# Patient Record
Sex: Female | Born: 1955
Health system: Southern US, Community
[De-identification: ages and names within clinical notes are randomized; demographics above are authoritative.]

## PROBLEM LIST (undated history)

## (undated) DIAGNOSIS — F419 Anxiety disorder, unspecified: Secondary | ICD-10-CM

## (undated) DIAGNOSIS — C801 Malignant (primary) neoplasm, unspecified: Secondary | ICD-10-CM

## (undated) DIAGNOSIS — Z9049 Acquired absence of other specified parts of digestive tract: Secondary | ICD-10-CM

## (undated) DIAGNOSIS — C541 Malignant neoplasm of endometrium: Secondary | ICD-10-CM

## (undated) DIAGNOSIS — F32A Depression, unspecified: Secondary | ICD-10-CM

## (undated) DIAGNOSIS — G4700 Insomnia, unspecified: Secondary | ICD-10-CM

## (undated) DIAGNOSIS — I10 Essential (primary) hypertension: Principal | ICD-10-CM

## (undated) DIAGNOSIS — F329 Major depressive disorder, single episode, unspecified: Secondary | ICD-10-CM

## (undated) DIAGNOSIS — Z720 Tobacco use: Secondary | ICD-10-CM

## (undated) DIAGNOSIS — Z923 Personal history of irradiation: Secondary | ICD-10-CM

## (undated) DIAGNOSIS — R002 Palpitations: Secondary | ICD-10-CM

## (undated) DIAGNOSIS — E669 Obesity, unspecified: Secondary | ICD-10-CM

## (undated) DIAGNOSIS — R05 Cough: Secondary | ICD-10-CM

## (undated) DIAGNOSIS — G629 Polyneuropathy, unspecified: Secondary | ICD-10-CM

## (undated) HISTORY — DX: Malignant neoplasm of endometrium: C54.1

## (undated) HISTORY — PX: LUMBAR DISC SURGERY: SHX700

## (undated) HISTORY — DX: Obesity, unspecified: E66.9

## (undated) HISTORY — DX: Personal history of irradiation: Z92.3

## (undated) HISTORY — PX: TUBAL LIGATION: SHX77

## (undated) HISTORY — DX: Tobacco use: Z72.0

## (undated) HISTORY — DX: Cough: R05

## (undated) HISTORY — DX: Essential (primary) hypertension: I10

## (undated) HISTORY — DX: Major depressive disorder, single episode, unspecified: F32.9

## (undated) HISTORY — DX: Depression, unspecified: F32.A

## (undated) HISTORY — DX: Polyneuropathy, unspecified: G62.9

## (undated) HISTORY — DX: Palpitations: R00.2

## (undated) HISTORY — DX: Anxiety disorder, unspecified: F41.9

## (undated) HISTORY — DX: Acquired absence of other specified parts of digestive tract: Z90.49

---

## 2003-10-04 ENCOUNTER — Ambulatory Visit (HOSPITAL_COMMUNITY): Admission: RE | Admit: 2003-10-04 | Discharge: 2003-10-05 | Payer: Self-pay | Admitting: Specialist

## 2005-09-10 HISTORY — PX: CHOLECYSTECTOMY: SHX55

## 2006-02-05 ENCOUNTER — Ambulatory Visit (HOSPITAL_COMMUNITY): Admission: RE | Admit: 2006-02-05 | Discharge: 2006-02-06 | Payer: Self-pay | Admitting: Neurosurgery

## 2008-10-03 ENCOUNTER — Ambulatory Visit: Payer: Self-pay | Admitting: Gastroenterology

## 2008-10-03 ENCOUNTER — Inpatient Hospital Stay (HOSPITAL_COMMUNITY): Admission: EM | Admit: 2008-10-03 | Discharge: 2008-10-06 | Payer: Self-pay | Admitting: Emergency Medicine

## 2008-10-04 ENCOUNTER — Encounter (INDEPENDENT_AMBULATORY_CARE_PROVIDER_SITE_OTHER): Payer: Self-pay | Admitting: *Deleted

## 2010-12-25 LAB — CBC
HCT: 36.6 % (ref 36.0–46.0)
HCT: 43.6 % (ref 36.0–46.0)
Hemoglobin: 14.7 g/dL (ref 12.0–15.0)
MCHC: 32.9 g/dL (ref 30.0–36.0)
MCV: 89.5 fL (ref 78.0–100.0)
Platelets: 132 10*3/uL — ABNORMAL LOW (ref 150–400)
RBC: 4.93 MIL/uL (ref 3.87–5.11)
RDW: 13.8 % (ref 11.5–15.5)
WBC: 5.4 10*3/uL (ref 4.0–10.5)

## 2010-12-25 LAB — URINALYSIS, ROUTINE W REFLEX MICROSCOPIC
Glucose, UA: NEGATIVE mg/dL
Ketones, ur: 15 mg/dL — AB
Nitrite: NEGATIVE
Protein, ur: NEGATIVE mg/dL

## 2010-12-25 LAB — COMPREHENSIVE METABOLIC PANEL
ALT: 679 U/L — ABNORMAL HIGH (ref 0–35)
AST: 109 U/L — ABNORMAL HIGH (ref 0–37)
Albumin: 3.4 g/dL — ABNORMAL LOW (ref 3.5–5.2)
Alkaline Phosphatase: 302 U/L — ABNORMAL HIGH (ref 39–117)
BUN: 14 mg/dL (ref 6–23)
BUN: 8 mg/dL (ref 6–23)
CO2: 24 mEq/L (ref 19–32)
CO2: 28 mEq/L (ref 19–32)
Calcium: 8.8 mg/dL (ref 8.4–10.5)
Chloride: 108 mEq/L (ref 96–112)
Chloride: 108 mEq/L (ref 96–112)
Creatinine, Ser: 0.74 mg/dL (ref 0.4–1.2)
GFR calc Af Amer: >60 mL/min (ref >60)
GFR calc non Af Amer: >60 mL/min (ref >60)
GFR calc non Af Amer: >60 mL/min (ref >60)
Glucose, Bld: 104 mg/dL — ABNORMAL HIGH (ref 70–99)
Potassium: 3.8 mEq/L (ref 3.5–5.1)
Total Bilirubin: 1.3 mg/dL — ABNORMAL HIGH (ref 0.3–1.2)
Total Bilirubin: 2.4 mg/dL — ABNORMAL HIGH (ref 0.3–1.2)

## 2010-12-25 LAB — URINE MICROSCOPIC-ADD ON

## 2010-12-25 LAB — DIFFERENTIAL
Basophils Absolute: 0 10*3/uL (ref 0.0–0.1)
Basophils Relative: 1 % (ref 0–1)
Eosinophils Absolute: 0.2 10*3/uL (ref 0.0–0.7)
Neutro Abs: 3.1 10*3/uL (ref 1.7–7.7)
Neutrophils Relative %: 58 % (ref 43–77)

## 2010-12-25 LAB — POCT I-STAT, CHEM 8
Chloride: 108 mEq/L (ref 96–112)
Glucose, Bld: 103 mg/dL — ABNORMAL HIGH (ref 70–99)
HCT: 47 % — ABNORMAL HIGH (ref 36.0–46.0)
Potassium: 3.8 mEq/L (ref 3.5–5.1)
Sodium: 141 mEq/L (ref 135–145)

## 2010-12-25 LAB — PROTIME-INR
INR: 1.1 (ref 0.00–1.49)
Prothrombin Time: 14.4 seconds (ref 11.6–15.2)

## 2011-01-23 NOTE — Op Note (Signed)
Amy Holmes, JOLLIFFE                ACCOUNT NO.:  [id number]   MEDICAL RECORD NO.:  [id number]          PATIENT TYPE:  INP   LOCATION:  5121                         FACILITY:  [facility]   PHYSICIAN:  Alfonse Ras, MD   DATE OF BIRTH:  [birth year]   DATE OF PROCEDURE:  10/04/2008  DATE OF DISCHARGE:                               OPERATIVE REPORT   PREOPERATIVE DIAGNOSIS:  Choledocholithiasis, acute cholecystitis.   POSTOPERATIVE DIAGNOSIS:  Choledocholithiasis, acute cholecystitis.   PROCEDURE:  Laparoscopic cholecystectomy with intraoperative  cholangiogram.   FINDINGS:  Distal retained common bile duct stone and retrieval of 3  common bile duct stones.   SURGEON:  Alfonse Ras, MD   ANESTHESIA:  General.   ASSISTANT:  Letha Cape, PA   DESCRIPTION:  After informed consent was granted from the patient, she  was taken to the operating room, placed in supine position and after  adequate general anesthesia was induced using endotracheal tube, the  abdomen was prepped and draped in normal sterile fashion.  Using a  transverse simple infraumbilical incision, I dissected down to fascia.  Fascia was opened vertically.  An 0 Vicryl pursestring suture was placed  around the fascial defect and Hasson trocar was placed in the abdomen.  Pneumoperitoneum was obtained.  Under direct vision, 11-mm trocar was  placed in the subxiphoid region and two 5-mm trocars were placed in the  right abdomen.  Gallbladder was identified and retracted cephalad.  Dissection at the neck of the gallbladder easily visualized the cystic  duct, which was clipped proximally.  Critical view was obtained prior to  clipping.  A small ductotomy was made and a number of stones in the  cystic duct were milked out.  Also with some pressure on the common  duct, two small stones were evacuated.  Cholangiocatheter was then  placed and cholangiogram was performed.  It showed no flow into the  duodenum and a dilated  common bile duct.  There was a filling defect  which floated consistent with a distal common bile duct stone.  With  advancing the catheter which showed a fairly acute angle, it was  difficult to get the balloon down below the stone.  Therefore, the  cholangiocatheter was removed.  The cystic duct was triply clipped and  divided.  Cystic artery was identified in a similar fashion, triply  clipped and divided.  Gallbladder was taken off the gallbladder bed  using Bovie electrocautery, placed in EndoCatch bag and removed through  the umbilical port.  Adequate hemostasis was assured.  The infraumbilical fascial defect was  closed with the 0 Vicryl pursestring suture.  Skin incisions were closed  with subcuticular 4-0 Monocryl.  Steri-Strips and sterile dressings were  applied.  The patient tolerated the procedure well, went to PACU in good  condition.      Alfonse Ras, MD  Electronically Signed     KRE/MEDQ  D:  10/04/2008  T:  10/05/2008  Job:  [id number]   cc:   Jordan Hawks. Elnoria Howard, MD

## 2011-01-23 NOTE — H&P (Signed)
Amy Holmes, Amy Holmes                ACCOUNT NO.:  [id number]   MEDICAL RECORD NO.:  [id number]          PATIENT TYPE:  INP   LOCATION:  5121                         FACILITY:  [facility]   PHYSICIAN:  Juanetta Gosling, MDDATE OF BIRTH:  [birth year]   DATE OF ADMISSION:  10/03/2008  DATE OF DISCHARGE:                              HISTORY & PHYSICAL   CHIEF COMPLAINT:  Epigastric pain.   HISTORY OF PRESENT ILLNESS:  A 55 year old female who developed  epigastric pain last Sunday, continued over the last week.  She does  complain of some dark-colored urine.  She has not been eating much over  the last week due to the pain.  She does have a history of dyspepsia as  well for the last 4 years.  She denies any fevers, has had some nausea  and had 1 episode of emesis, also with some loose stool.   PAST MEDICAL HISTORY:  Negative.   PAST SURGICAL HISTORY:  Bilateral tubal ligation and lumbar surgery x2.   SOCIAL HISTORY:  She is smoker and drinks alcohol socially.   ALLERGIES:  She has no known drug allergies.   REVIEW OF SYSTEMS:  Otherwise, negative.   PHYSICAL EXAMINATION:  VITAL SIGNS:  97.0, 79, 16, and 159/90.  GENERAL:  She is a well-appearing female in no distress.  NECK:  Supple without adenopathy.  LUNGS:  Clear bilaterally.  HEART:  Regular rate and rhythm.  ABDOMEN:  Soft, nontender, and nondistended with no Murphy sign.   LABORATORY DATA:  White blood cell count of 5.4, hematocrit of 43.6, and  platelets of 170.  Lipase is 26.  BUN and creatinine 14 and 0.63.  Her  alkaline phosphatase is 302.  Her transaminases are 282 and 679.  Her  bilirubin is 2.4.  She has an ultrasound.  A CT was done with biliary  ductal dilatation.  Question is choledocholithiasis and symptomatic  cholelithiasis .   PLAN OF ACTION:  Admission, n.p.o. after midnight, IV fluid, GI consult  for possible ERCP.  We will plan on rechecking her labs in the morning  and then proceed either with ERCP  versus laparoscopic cholecystectomy.  Regardless after an ERCP, she will need her gallbladder removed this  admission.     Juanetta Gosling, MD  Electronically Signed    MCW/MEDQ  D:  10/03/2008  T:  10/04/2008  Job:  [id number]

## 2011-01-23 NOTE — Discharge Summary (Signed)
NAMELYRIC, ROSSANO                ACCOUNT NO.:  [id number]   MEDICAL RECORD NO.:  [id number]          PATIENT TYPE:  INP   LOCATION:  5121                         FACILITY:  [facility]   PHYSICIAN:  Alfonse Ras, MD   DATE OF BIRTH:  [birth year]   DATE OF ADMISSION:  10/03/2008  DATE OF DISCHARGE:  10/06/2008                               DISCHARGE SUMMARY   ADMITTING PHYSICIAN:  Juanetta Gosling, MD   DISCHARGING PHYSICIAN:  Alfonse Ras, MD   CONSULTANTS:  Jordan Hawks. Elnoria Howard, MD with Gastroenterology.   PROCEDURES:  1. Laparoscopic cholecystectomy with intraoperative cholangiogram done      on October 04, 2008, by Dr. Colin Benton.  2. ERCP done by Dr. Elnoria Howard on October 05, 2008.   REASON FOR ADMISSION:  Amy Holmes is a 55 year old white female who  developed epigastric pain on Sunday night and continued over the next  week.  She has complained of some dark-colored urine.  She has not been  eating much secondary to her pain.  She denies fevers, but has had some  nausea and 1 episode of emesis.  On admission, her alkaline phosphatase  was 302.  Her transaminases are 282 and 679 with a total bilirubin of  2.4.  She does have a CT scan that shows biliary ductal dilatation and  question choledocholithiasis and symptomatic cholelithiasis.  Please see  admitting history and physical for further details.   ADMITTING DIAGNOSES:  1. Choledocholithiasis.  2. Symptomatic cholelithiasis.   HOSPITAL COURSE:  At this time, the patient was admitted and started on  Unasyn prophylactically.  A GI consult was obtained secondary to the  thought of choledocholithiasis as well as evidence from her labs.  At  this time, we were going to follow her labs and on the following day if  her labs were trending down, we will go ahead and proceed with a  laparoscopic cholecystectomy; however, if her labs were trending upwards  then we would have GI do an ERCP.  On the following day, the patient's  lab had  decreased and her total bilirubin was now 1.3.  Therefore at  this time, we proceeded with a laparoscopic cholecystectomy.  During  this, we did an intraoperative cholangiogram where it was found that the  patient did have some remaining common bile duct stones.  The patient  tolerated this procedure well and after the procedure, GI was called to  come back in and do an ERCP.  On the following day postoperative day 1,  the patient was feeling well and at this time was taken down for an  ERCP.  An ERCP was done with a sphincterotomy.  There were several small  stones that were extracted without difficulty.  The patient once again  tolerated this procedure well and her diet was advanced postprocedure.  By postoperative day 2, the patient was feeling great and ready to go  home.  She was not having any pain and was tolerating regular diet.  Her  abdomen was soft, nontender, and nondistended with active bowel sounds  and her incisions were  clean, dry, and intact.  At this time, the  patient was felt stable for discharge home.   DISCHARGE DIAGNOSES:  1. Choledocholithiasis.  2. Cholecystitis.  3. Cholelithiasis.  4. Status post laparoscopic cholecystectomy with intraoperative      cholangiogram.  5. Status post endoscopic retrograde cholangiopancreatography with      sphincterotomy.   DISCHARGE MEDICATIONS:  The patient does not have any home medications;  however, I have given her prescription for Percocet 5/325 one to two  p.o. q.4 h. p.r.n. pain.   DISCHARGE INSTRUCTIONS:  The patient is informed that she may return to  work within the next 1-2 weeks.  She has no dietary restrictions.  She  may increase her activity slowly and she may walk up steps.  She is  informed that she may shower; however, she is not to lift anything  greater than 15 pounds for the next 2 weeks.  She is also informed that  she may remove her outer tape and gauze today prior to showering.  She  is to call our  office for a fever greater than 101.5, worsening  abdominal pain or redness or pus-like drainage from any of her  incisions.  [facility] to [facility] to see myself on  October 19, 2008, at 2 p.m.  She is informed to call our office if she  cannot keep this appointment to reschedule.      Letha Cape, PA      Alfonse Ras, MD  Electronically Signed    KEO/MEDQ  D:  10/06/2008  T:  10/06/2008  Job:  [id number]   cc:   Jordan Hawks. Elnoria Howard, MD

## 2011-01-26 NOTE — Op Note (Signed)
NAME:  Amy Holmes, Amy Holmes                          ACCOUNT NO.:  [id number]   MEDICAL RECORD NO.:  [id number]                   PATIENT TYPE:  OIB   LOCATION:  2899                                 FACILITY:  [facility]   PHYSICIAN:  Jene Every, M.D.                 DATE OF BIRTH:  1956-03-15   DATE OF PROCEDURE:  10/04/2003  DATE OF DISCHARGE:                                 OPERATIVE REPORT   PREOPERATIVE DIAGNOSIS:  Spinal stenosis, herniated nucleus pulposus, L5.   POSTOPERATIVE DIAGNOSIS:  Spinal stenosis, herniated nucleus pulposus, L5.   OPERATION PERFORMED:  Lateral recess decompression, foraminotomy, L4-5.  Microdiskectomy.  Lateral recess decompression.   SURGEON:  Jene Every, M.D.   ANESTHESIA:  General.   ASSISTANT:  Roma Schanz, PA-C.Marland Kitchen   INDICATIONS FOR PROCEDURE:  The patient is a 55 year old with left lower  extremity radiculopathy, extensor hallucis longus weakness. MRI indicating  herniated nucleus pulposus at 4-5 paracentral to the left with lateral  recess stenosis, ligamentum flavum hypertrophy, facet arthropathy, positive  neural tension signs, EHL weakness, decreased sensation in the L5 dermatome  at a transitional segment of 4-5, last open disk space 4-5.  Operative  intervention was indicated for decompression.  Risks and benefits discussed  including bleeding, infection, damage to neurovascular structures, CSF  leakage, epidural fibrosis, adjacent segment disease, no change in symptoms.   DESCRIPTION OF PROCEDURE:  Patient supine position, after adequate level of  general anesthesia, she was placed prone on a Wilson frame.  500 mg IV  vancomycin.  Soft tissue was padded appropriately.  Lumbar region was  prepped and draped in the usual sterile fashion.  18 gauge needle was  utilized to localize the 4-5 interspace confirmed with x-ray.  Incision was  made from the spinous process 4 to 5.  Subcutaneous tissue dissected,  electrocautery utilized to  achieve hemostasis.  Dorsolumbar fascia  identified and divided in line of skin incision. Paraspinous muscle elevated  from lamina of 4 and 5.  McCullough retractor was placed.  Penfield 4 placed  in the interlaminar space confirmed on x-ray.  Operating microscope draped  and brought into the surgical field.  The ligamentum flavum detached from  the cephalad edge of 5 with a curet.  The caudad edge of 4.  Hemilaminotomy  performed of 4 and of 5 utilizing 2 and 3 mm Kerrison.  Severe compression  of the S1 nerve root and lateral recess stenosis secondary to ligamentum  flavum, facet hypertrophy.  This __________ decompressed with 2 mm Kerrison.  A nerve hook was utilized to mobilize the free fragment on the axilla of the  nerve root.  The nerve root was gently mobilized medially.  Two more large  fragments were mobilized from beneath the nerve root.  Checked down the  foramen.  There was no disk herniation noted there.  There was an extension  of the disk herniation and  fragment beneath the thecal sac.  This was  retrieved as well.  Annulotomy was performed and copious portion of the disk  material was removed from the disk space.  There was no residual disk  herniation noted.  The nerve was found to be erythematous and edematous but  well decompressed.  There was excursion of the root 1 cm medial to the  pedicle without difficulty.  Hockey stitch probe was placed in foramen and  found to be widely patent following decompression.  Disk space copiously  irrigated.  I again re-examined the axilla beneath the thecal sac and nerve  root, the foramina of 4 and 5 were found to be widely patent. There was no  compression upon the root.  Disk space copiously irrigated.  Bipolar  electrocautery utilized to achieve hemostasis as was Thrombin soaked  Gelfoam.  This was then all removed.  Disk space copiously irrigated once  again. No CSF leakage or active bleeding.  Removed the [facility] retractor.   Paraspinous muscles were inspected  no evidence of active bleeding.  Dorsal  lumbar fascia reapproximated in #1 Vicryl figure-of-eight sutures,  subcutaneous tissue reapproximated 2-0 Vicryl subcuticular, skin  reapproximated 4-0 subcuticular Prolene.  Wound reinforced with Steri-  Strips.  Sterile dressing applied.  She was placed supine on the hospital  bed, extubated without difficulty and transported to recovery room in  satisfactory condition.  The patient tolerated the procedure well.  There  were no complications.                                               Jene Every, M.D.    Cordelia Pen  D:  10/04/2003  T:  10/04/2003  Job:  [id number]

## 2011-01-26 NOTE — Op Note (Signed)
Amy Holmes, Amy Holmes                ACCOUNT NO.:  [id number]   MEDICAL RECORD NO.:  [id number]          PATIENT TYPE:  AMB   LOCATION:  SDS                          FACILITY:  [facility]   PHYSICIAN:  Payton Doughty, M.D.      DATE OF BIRTH:  [birth year]   DATE OF PROCEDURE:  02/05/2006  DATE OF DISCHARGE:                                 OPERATIVE REPORT   PREOPERATIVE DIAGNOSIS:  Recurrent herniated disk on the left at L5-S1.   POSTOPERATIVE DIAGNOSIS:  Recurrent herniated disk on the left at L5-S1.   OPERATION PERFORMED:  L5-S1 recurrent diskectomy.   SURGEON:  Payton Doughty, M.D.   ANESTHESIA:  General endotracheal.   PREP:  Sterile Betadine prep and scrub with alcohol wipe.   COMPLICATIONS:  None.   NURSE ASSISTANT:  [place].   DOCTOR ASSISTANT:  Clydene Fake, M.D.   INDICATIONS FOR PROCEDURE:  The patient is a 55 year old girl with  diskectomy several years ago, has had increasing pain in her left leg.   DESCRIPTION OF PROCEDURE:  The patient was taken to the operating room,  smoothly anesthetized, intubated, and placed prone on the operating table.  Following shave, prep and drape in usual fashion, old skin incision was  reopened and extended approximately 0.5 cm at each end.  The remaining  lamina of L5 dissected on the left side in the subperiosteal plane.  The  level was confirmed radiographically.  Working the scar away from the old  laminectomy site, slightly enlarging it with a Kerrison and __________ with  a high speed drill.  The left S1 root was identified.  Gentle lysis of the  scar revealed recurrent herniated disk underneath the root and this was  removed without difficulty.  Nerve root was explored carefully and found to  have no further encumbrance. The laminectomy defect was filled with Depo-  Medrol soaked fat.  The fascia and subcutaneous tissue was reapproximated  with 0 Vicryl in interrupted fashion.  Subcutaneous tissues reapproximated  with 2-0 Vicryl  in interrupted fashion.  Skin was closed with 3-0 nylon in  running locked fashion.  Betadine Telfa dressing was applied and made  occlusive with OpSite and the patient returned to recovery room in good  condition.           ______________________________  Payton Doughty, M.D.    MWR/MEDQ  D:  02/05/2006  T:  02/05/2006  Job:  [id number]

## 2011-01-26 NOTE — H&P (Signed)
Amy Holmes, Amy Holmes                ACCOUNT NO.:  [id number]   MEDICAL RECORD NO.:  [id number]          PATIENT TYPE:  AMB   LOCATION:  SDS                          FACILITY:  [facility]   PHYSICIAN:  Payton Doughty, M.D.      DATE OF BIRTH:  11-Nov-1955   DATE OF ADMISSION:  02/05/2006  DATE OF DISCHARGE:                                HISTORY & PHYSICAL   ADMITTING DIAGNOSES:  Recurrent herniated disk at L5-S1 on the left.   [name] had diskectomy done on left side at  L5-S1, L5 transitional segment.  Did well.  In November had an increase in  pain down her left leg and numbness lateral aspect left leg including the  left foot.  Saw her in my office.  Obtained an MR that shows a disk  recurrence at L5 transitional segment on the left side and she is admitted  now for redo diskectomy.   MEDICAL HISTORY:  Benign.  She is not on any medications.   SURGICAL HISTORY:  Back.   ALLERGIES:  None.   SOCIAL HISTORY:  She smokes a pack half cigarettes a day.  Drinks alcohol  once a week.  Is a Surveyor, minerals for [employer].   FAMILY HISTORY:  Mom is 59, in good health, diabetes.  Daddy is 72, in good  health, diabetes.   REVIEW OF SYSTEMS:  Remarkable for glasses, swelling in her hands and feet,  leg pain, and back pain.   PHYSICAL EXAMINATION:  HEENT:  Within normal limits.  She has reasonable  range of motion in her neck.  CHEST:  Clear.  CARDIAC:  Regular rate and rhythm.  ABDOMEN:  Nontender.  No hepatosplenomegaly.  EXTREMITIES:  Without clubbing, cyanosis.  GENITOURINARY:  Deferred.  PERIPHERAL PULSES:  Good.  NEUROLOGIC:  She is awake, alert, and oriented.  Cranial nerves are intact.  Motor examination shows 5/5 strength throughout the upper and lower  extremities.  Sensory deficit described in the left S1 distribution.  Reflexes are absent both lower extremities.  Straight leg raise and reverse  straight leg raise both positive for left  leg pain.   MR demonstrates recurrent herniated disk at L5-S1 eccentric to the left  side.   CLINICAL IMPRESSION:  Disk at L5-S1 and her L5 transitional segment.   PLAN:  Lumbar laminectomy, diskectomy.  Thought about fusing it but it has  been a while since her diskectomy and she has no evidence of instability so  hopefully just diskectomy will be sufficient.           ______________________________  Payton Doughty, M.D.     MWR/MEDQ  D:  02/05/2006  T:  02/05/2006  Job:  [id number]

## 2014-03-03 ENCOUNTER — Ambulatory Visit (INDEPENDENT_AMBULATORY_CARE_PROVIDER_SITE_OTHER): Payer: 59 | Admitting: Family Medicine

## 2014-03-03 ENCOUNTER — Encounter: Payer: Self-pay | Admitting: Family Medicine

## 2014-03-03 VITALS — BP 132/84 | HR 80 | Temp 98.1°F | Ht 66.5 in | Wt 192.0 lb

## 2014-03-03 DIAGNOSIS — Z7189 Other specified counseling: Secondary | ICD-10-CM

## 2014-03-03 DIAGNOSIS — Z7689 Persons encountering health services in other specified circumstances: Secondary | ICD-10-CM

## 2014-03-03 DIAGNOSIS — F32A Depression, unspecified: Secondary | ICD-10-CM

## 2014-03-03 DIAGNOSIS — F341 Dysthymic disorder: Secondary | ICD-10-CM

## 2014-03-03 DIAGNOSIS — R002 Palpitations: Secondary | ICD-10-CM

## 2014-03-03 DIAGNOSIS — F419 Anxiety disorder, unspecified: Secondary | ICD-10-CM

## 2014-03-03 DIAGNOSIS — F329 Major depressive disorder, single episode, unspecified: Secondary | ICD-10-CM

## 2014-03-03 MED ORDER — PAROXETINE HCL 10 MG PO TABS: 10.0000 mg | ORAL_TABLET | Freq: Every day | ORAL | Status: DC

## 2014-03-03 NOTE — Progress Notes (Signed)
No chief complaint on file.   HPI:  Amy Holmes is here to establish care.  Last PCP and physical: physical, mammo and pap in 2013 with a gynecologist  Has the following chronic problems and concerns today:  There are no active problems to display for this patient.  Stress/Hx of Depression/Poor sleep: -irritable, falls asleep ok but wakes up in the middle of the night, anxiety at times -denies: depression, crying, manix symptoms -hx mild depression on medication in the past -was exercising and taking good care of herself and [place] that [place] - but then her mom (and mother's boyfriend whom recently passed)moved in with her and has dementia and uncontrolled DM with complications, two sons recently moved in to help -occasional palpitations with stress the last 2 days, mild brief flutter sensation in chest, -denies: SI, CP, SOB  Tobacco Use: -cutting back -not a great time quit  Health Maintenance: - not interested in colon cancer screening or lung cancer screening ROS: See pertinent positives and negatives per HPI.  Past Medical History  Diagnosis Date  . Depression     Family History  Problem Relation Age of Onset  . Heart disease Mother   . Diabetes Mother   . Depression Maternal Grandmother   . Depression Maternal Grandfather     History   Social History  . Marital Status: Divorced    Spouse Name: N/A    Number of Children: N/A  . Years of Education: N/A   Social History Main Topics  . Smoking status: Current Every Day Smoker  . Smokeless tobacco: None     Comment: 5-7 cig per day  . Alcohol Use: Yes     Comment: 1 drink here and there  . Drug Use: None  . Sexual Activity: None   Other Topics Concern  . None   Social History Narrative   Work or School:      Home Situation:      Spiritual Beliefs:      Lifestyle:             Current outpatient prescriptions:PARoxetine (PAXIL) 10 MG tablet, Take 1 tablet (10 mg  total) by mouth daily., Disp: 30 tablet, Rfl: 3  EXAM:  Filed Vitals:   03/03/14 0818  BP: 132/84  Pulse: 80  Temp: 98.1 F (36.7 C)    Body mass index is 30.53 kg/(m^2).  GENERAL: vitals reviewed and listed above, alert, oriented, appears well hydrated and in no acute distress  HEENT: atraumatic, conjunttiva clear, no obvious abnormalities on inspection of external nose and ears  NECK: no obvious masses on inspection  LUNGS: clear to auscultation bilaterally, no wheezes, rales or rhonchi, good air movement  CV: HRRR, no peripheral edema  MS: moves all extremities without noticeable abnormality  PSYCH: pleasant and cooperative, no obvious depression or anxiety  ASSESSMENT AND PLAN:  Discussed the following assessment and plan:  Palpitations - Plan: Holter monitor - 24 hour -holter monitor, discussed etiologies, tx, return and emergency precautions etc  Anxiety and depression - Plan: PARoxetine (PAXIL) 10 MG tablet -discussed options, mild, she wants to do trial of paxil after discussion risks  Encounter to establish care  -We reviewed the PMH, PSH, FH, SH, Meds and Allergies. -We provided refills for any medications we will prescribe as needed. -We addressed current concerns per orders and patient instructions. -We have asked for records for pertinent exams, studies, vaccines and notes from previous providers. -We have advised patient to  follow up per instructions below.   -Patient advised to return or notify a doctor immediately if symptoms worsen or persist or new concerns arise.  Patient Instructions  -As we discussed, we have prescribed a new medication for you at this appointment. We discussed the common and serious potential adverse effects of this medication and you can review these and more with the pharmacist when you pick up your medication.  Please follow the instructions for use carefully and notify us immediately if you have any problems taking this  medication.  -we ordered the heart monitor  -follow up for physical and labs and follow up in 1 month - schedule today      KIM, Jarrett Soho R.

## 2014-03-03 NOTE — Patient Instructions (Signed)
-  As we discussed, we have prescribed a new medication for you at this appointment. We discussed the common and serious potential adverse effects of this medication and you can review these and more with the pharmacist when you pick up your medication.  Please follow the instructions for use carefully and notify us immediately if you have any problems taking this medication.  -we ordered the heart monitor  -follow up for physical and labs and follow up in 1 month - schedule today

## 2014-03-03 NOTE — Progress Notes (Signed)
Pre visit review using our clinic review tool, if applicable. No additional management support is needed unless otherwise documented below in the visit note. 

## 2014-03-09 ENCOUNTER — Encounter (INDEPENDENT_AMBULATORY_CARE_PROVIDER_SITE_OTHER): Payer: 59

## 2014-03-09 ENCOUNTER — Encounter: Payer: Self-pay | Admitting: *Deleted

## 2014-03-09 DIAGNOSIS — R002 Palpitations: Secondary | ICD-10-CM

## 2014-03-09 NOTE — Progress Notes (Signed)
Patient ID: Amy Holmes, female   DOB: [birth year], 58 y.o.   MRN: [id number] EVO 24 hour holter monitor applied to patient.

## 2014-03-22 ENCOUNTER — Encounter: Payer: Self-pay | Admitting: *Deleted

## 2014-03-22 ENCOUNTER — Telehealth: Payer: Self-pay | Admitting: Family Medicine

## 2014-03-22 DIAGNOSIS — I493 Ventricular premature depolarization: Secondary | ICD-10-CM

## 2014-03-22 DIAGNOSIS — I491 Atrial premature depolarization: Secondary | ICD-10-CM

## 2014-03-22 NOTE — Telephone Encounter (Signed)
[phone] (home) [phone] (work) Still with daily mild palpitations. No CP, SOB, dyspnea, sustained episodes of heart racing.Discussed results of holter monitor, etiologies, workup, tx - she opted to see cardiologist and referral placed.

## 2014-04-02 ENCOUNTER — Ambulatory Visit (INDEPENDENT_AMBULATORY_CARE_PROVIDER_SITE_OTHER): Payer: 59 | Admitting: Family Medicine

## 2014-04-02 ENCOUNTER — Encounter: Payer: Self-pay | Admitting: Family Medicine

## 2014-04-02 VITALS — BP 128/84 | HR 75 | Temp 98.3°F | Ht 66.5 in | Wt 192.0 lb

## 2014-04-02 DIAGNOSIS — F419 Anxiety disorder, unspecified: Principal | ICD-10-CM

## 2014-04-02 DIAGNOSIS — Z0000 Encounter for general adult medical examination without abnormal findings: Secondary | ICD-10-CM

## 2014-04-02 DIAGNOSIS — R002 Palpitations: Secondary | ICD-10-CM

## 2014-04-02 DIAGNOSIS — E669 Obesity, unspecified: Secondary | ICD-10-CM

## 2014-04-02 DIAGNOSIS — F17200 Nicotine dependence, unspecified, uncomplicated: Secondary | ICD-10-CM

## 2014-04-02 DIAGNOSIS — F329 Major depressive disorder, single episode, unspecified: Secondary | ICD-10-CM

## 2014-04-02 DIAGNOSIS — F32A Depression, unspecified: Secondary | ICD-10-CM

## 2014-04-02 DIAGNOSIS — F341 Dysthymic disorder: Secondary | ICD-10-CM

## 2014-04-02 LAB — BASIC METABOLIC PANEL
BUN: 18 mg/dL (ref 6–23)
CHLORIDE: 106 meq/L (ref 96–112)
CO2: 32 meq/L (ref 19–32)
CREATININE: 0.7 mg/dL (ref 0.4–1.2)
Calcium: 9.4 mg/dL (ref 8.4–10.5)
GFR: 95.7 mL/min (ref >60.00)
Glucose, Bld: 110 mg/dL — ABNORMAL HIGH (ref 70–99)
Potassium: 4.2 mEq/L (ref 3.5–5.1)
Sodium: 142 mEq/L (ref 135–145)

## 2014-04-02 LAB — LIPID PANEL
CHOL/HDL RATIO: 3
CHOLESTEROL: 197 mg/dL (ref 0–200)
HDL: 59.7 mg/dL (ref >39.00)
LDL CALC: 112 mg/dL — AB (ref 0–99)
NONHDL: 137.3
Triglycerides: 127 mg/dL (ref 0.0–149.0)
VLDL: 25.4 mg/dL (ref 0.0–40.0)

## 2014-04-02 LAB — T4, FREE: Free T4: 0.91 ng/dL (ref 0.60–1.60)

## 2014-04-02 LAB — HEMOGLOBIN A1C: Hgb A1c MFr Bld: 6.2 % (ref 4.6–6.5)

## 2014-04-02 LAB — TSH: TSH: 1.04 u[IU]/mL (ref 0.35–4.50)

## 2014-04-02 MED ORDER — PAROXETINE HCL 10 MG PO TABS: 10.0000 mg | ORAL_TABLET | Freq: Every day | ORAL | Status: DC

## 2014-04-02 NOTE — Patient Instructions (Signed)
-  We have ordered labs or studies at this visit. It can take up to 1-2 weeks for results and processing. We will contact you with instructions IF your results are abnormal. Normal results will be released to your [facility]. If you have not heard from [place] or can not find your results in [facility] in 2 weeks please contact our office.  -PLEASE SIGN UP FOR MYCHART TODAY   We recommend the following healthy lifestyle measures: - eat a healthy diet consisting of lots of vegetables, fruits, beans, nuts, seeds, healthy meats such as white chicken and fish and whole grains.  - avoid fried foods, fast food, processed foods, sodas, red meet and other fattening foods.  - get a least 150 minutes of aerobic exercise per week.   Please make sure to get your pap smear and mammogram with your gyneclogist  Please quit smoking  Follow up in: 3-4 months or sooner as needed

## 2014-04-02 NOTE — Progress Notes (Signed)
Pre visit review using our clinic review tool, if applicable. No additional management support is needed unless otherwise documented below in the visit note. 

## 2014-04-02 NOTE — Progress Notes (Signed)
No chief complaint on file.   HPI:  Follow up - wants preventive visit and labs too.  Stress/Hx of Depression/Poor sleep: -at [place] 03/03/14 reported family related stress, irritability, poor sleep, anxiety and hx of depression  -started paxil 10mg  daily 02/2014 -reports today: doing well, cutting back on cigs, trying to do something every night - walking, getting out of the house -denies: SI, thoughts of self harm  Palpitations: -at visit 03/03/14 she reported occasional palpitations with stress the last 2 days, mild brief flutter sensation in chest -holter monitor 02/2013 showed frequent PVCs, frequent PACs and occ short runs of SVT per report -reports today: has cut back on caffeine, doing better, only occ palpitations now -she wants to see the cardiologist - has appt scheduled for this in august -denies: SI, CP, SOB   Tobacco Use:  -cutting back on cigarettes -not a great time quit  HM: Sees gyn for female physical: -she has mammo and pap scheduled -colon cancer screening: reports had colonoscopy about 20 years ago   ROS: See pertinent positives and negatives per HPI. Review of Systems  Constitutional: Negative for fever, weight loss and malaise/fatigue.  HENT: Negative for hearing loss.   Eyes: Negative for blurred vision and pain.  Respiratory: Negative for cough, hemoptysis and shortness of breath.   Cardiovascular: Negative for chest pain and palpitations.  Gastrointestinal: Negative for blood in stool and melena.  Genitourinary: Negative for dysuria.  Musculoskeletal: Negative for falls.  Skin: Negative for rash.  Neurological: Negative for dizziness.  Endo/Heme/Allergies: Does not bruise/bleed easily.  Psychiatric/Behavioral: Negative for depression and memory loss.     Past Medical History  Diagnosis Date  . Depression     Past Surgical History  Procedure Laterality Date  . Lumbar disc surgery      Family History  Problem Relation Age of Onset  . Heart  disease Mother   . Diabetes Mother   . Depression Maternal Grandmother   . Depression Maternal Grandfather     History   Social History  . Marital Status: Divorced    Spouse Name: N/A    Number of Children: N/A  . Years of Education: N/A   Social History Main Topics  . Smoking status: Current Every Day Smoker  . Smokeless tobacco: None     Comment: 5-7 cig per day  . Alcohol Use: Yes     Comment: 1 drink here and there  . Drug Use: None  . Sexual Activity: None   Other Topics Concern  . None   Social History Narrative   Work or School:      Home Situation:      Spiritual Beliefs:      Lifestyle:             Current outpatient prescriptions:PARoxetine (PAXIL) 10 MG tablet, Take 1 tablet (10 mg total) by mouth daily., Disp: 90 tablet, Rfl: 3  EXAM:  Filed Vitals:   04/02/14 0849  BP: 128/84  Pulse: 75  Temp: 98.3 F (36.8 C)    Body mass index is 30.53 kg/(m^2).  GENERAL: vitals reviewed and listed above, alert, oriented, appears well hydrated and in no acute distress  HEENT: atraumatic, conjunttiva clear, no obvious abnormalities on inspection of external nose and ears  NECK: no obvious masses on inspection  LUNGS: clear to auscultation bilaterally, no wheezes, rales or rhonchi, good air movement  CV: HRRR, no peripheral edema  GU: declined  MS: moves all extremities without noticeable abnormality  PSYCH: pleasant  and cooperative, no obvious depression or anxiety  ASSESSMENT AND PLAN:  Discussed the following assessment and plan:  Anxiety and depression - Plan: TSH, T4, Free, PARoxetine (PAXIL) 10 MG tablet  Palpitations - Plan: TSH, T4, Free, Lipid panel  Tobacco use disorder  Obesity, unspecified - Plan: TSH, Basic metabolic panel, Lipid panel, Hemoglobin A1c  Visit for preventive health examination - Plan: TSH, T4, Free, Basic metabolic panel, Lipid panel, Hemoglobin A1c, Ambulatory referral to Gastroenterology  -discussed options  for further eval and treatment of palpitations and she opted for seeing the cardiologist as scheduled -continue prozac -level A and B USPSTF recs discussed -smoking cessaion advised and couneling provided 3-10 minutes -FASTING labs -gyn exam with her gynecologist schedule - is do mammo there as well -colonoscopy referral placed -advised to quit smoking and offered help -risks of supplements dicussed -Patient advised to return or notify a doctor immediately if symptoms worsen or persist or new concerns arise.  Patient Instructions  -We have ordered labs or studies at this visit. It can take up to 1-2 weeks for results and processing. We will contact you with instructions IF your results are abnormal. Normal results will be released to your [facility]. If you have not heard from [place] or can not find your results in [facility] in 2 weeks please contact our office.  -PLEASE SIGN UP FOR MYCHART TODAY   We recommend the following healthy lifestyle measures: - eat a healthy diet consisting of lots of vegetables, fruits, beans, nuts, seeds, healthy meats such as white chicken and fish and whole grains.  - avoid fried foods, fast food, processed foods, sodas, red meet and other fattening foods.  - get a least 150 minutes of aerobic exercise per week.   Please make sure to get your pap smear and mammogram with your gyneclogist  Please quit smoking  Follow up in: 3-4 months or sooner as needed      KIM, HANNAH R.

## 2014-04-27 ENCOUNTER — Ambulatory Visit: Payer: 59 | Admitting: Cardiovascular Disease

## 2014-05-07 ENCOUNTER — Encounter: Payer: Self-pay | Admitting: Family Medicine

## 2014-05-19 ENCOUNTER — Ambulatory Visit: Payer: 59 | Admitting: Cardiovascular Disease

## 2014-07-26 ENCOUNTER — Encounter: Payer: Self-pay | Admitting: Family Medicine

## 2014-09-29 ENCOUNTER — Ambulatory Visit (INDEPENDENT_AMBULATORY_CARE_PROVIDER_SITE_OTHER): Payer: BLUE CROSS/BLUE SHIELD | Admitting: Family Medicine

## 2014-09-29 VITALS — BP 169/84 | HR 81 | Temp 98.5°F | Resp 16 | Ht 66.0 in | Wt 193.4 lb

## 2014-09-29 DIAGNOSIS — N950 Postmenopausal bleeding: Secondary | ICD-10-CM

## 2014-09-29 LAB — POCT CBC
Granulocyte percent: 59.3 %G (ref 37–80)
HCT, POC: 43.7 % (ref 37.7–47.9)
HEMOGLOBIN: 14.2 g/dL (ref 12.2–16.2)
Lymph, poc: 2.5 (ref 0.6–3.4)
MCH, POC: 29.1 pg (ref 27–31.2)
MCHC: 32.4 g/dL (ref 31.8–35.4)
MCV: 89.7 fL (ref 80–97)
MID (cbc): 0.5 (ref 0–0.9)
MPV: 7.6 fL (ref 0–99.8)
PLATELET COUNT, POC: 202 10*3/uL (ref 142–424)
POC GRANULOCYTE: 4.3 (ref 2–6.9)
POC LYMPH %: 34.4 % (ref 10–50)
POC MID %: 6.3 % (ref 0–12)
RBC: 4.87 M/uL (ref 4.04–5.48)
RDW, POC: 14.1 %
WBC: 7.3 10*3/uL (ref 4.6–10.2)

## 2014-09-29 NOTE — Progress Notes (Signed)
Subjective: Patient is 5 years postmenopausal. She has developed vaginal bleeding over the last few days. She had not had any bleeding for the 5 year. She has not had sexual relations with her husband for 2 years. Has not had any trauma to her pelvis or abdomen.  Objective: Abdomen soft and nontender uterus is slightly enlarged, not really of any obvious significance on palpation. No adnexal or uterine masses. She is bleeding from the os. Pap was taken. Bimanual exam did not reveal anything else.  Assessment: Postmenopausal bleeding  Plan: Her to GYN. Patient will need an ultrasound and an endometrial biopsy. ER if worse.    Results for orders placed or performed in visit on 09/29/14  POCT CBC  Result Value Ref Range   WBC 7.3 4.6 - 10.2 K/uL   Lymph, poc 2.5 0.6 - 3.4   POC LYMPH PERCENT 34.4 10 - 50 %L   MID (cbc) 0.5 0 - 0.9   POC MID % 6.3 0 - 12 %M   POC Granulocyte 4.3 2 - 6.9   Granulocyte percent 59.3 37 - 80 %G   RBC 4.87 4.04 - 5.48 M/uL   Hemoglobin 14.2 12.2 - 16.2 g/dL   HCT, POC 43.7 37.7 - 47.9 %   MCV 89.7 80 - 97 fL   MCH, POC 29.1 27 - 31.2 pg   MCHC 32.4 31.8 - 35.4 g/dL   RDW, POC 14.1 %   Platelet Count, POC 202 142 - 424 K/uL   MPV 7.6 0 - 99.8 fL

## 2014-09-29 NOTE — Patient Instructions (Addendum)
Referral is being made to a gynecologist. We will try to get you in as soon as possible. If bleeding gets heavier please return or go to the emergency room if necessary.

## 2014-10-04 LAB — PAP IG, CT-NG, RFX HPV ASCU
Chlamydia Probe Amp: NEGATIVE
GC PROBE AMP: NEGATIVE

## 2014-10-06 ENCOUNTER — Encounter: Payer: Self-pay | Admitting: Family Medicine

## 2014-10-08 ENCOUNTER — Emergency Department (HOSPITAL_COMMUNITY)
Admission: EM | Admit: 2014-10-08 | Discharge: 2014-10-08 | Discharge status: Absent without leave | Payer: BLUE CROSS/BLUE SHIELD | Source: Home / Self Care

## 2014-10-09 ENCOUNTER — Encounter: Payer: Self-pay | Admitting: Family Medicine

## 2014-10-09 ENCOUNTER — Ambulatory Visit (INDEPENDENT_AMBULATORY_CARE_PROVIDER_SITE_OTHER): Payer: BLUE CROSS/BLUE SHIELD | Admitting: Family Medicine

## 2014-10-09 VITALS — BP 150/72 | HR 84 | Temp 99.1°F | Wt 193.0 lb

## 2014-10-09 DIAGNOSIS — Z720 Tobacco use: Secondary | ICD-10-CM

## 2014-10-09 DIAGNOSIS — J209 Acute bronchitis, unspecified: Secondary | ICD-10-CM | POA: Insufficient documentation

## 2014-10-09 DIAGNOSIS — F17200 Nicotine dependence, unspecified, uncomplicated: Secondary | ICD-10-CM | POA: Insufficient documentation

## 2014-10-09 MED ORDER — ALBUTEROL SULFATE HFA 108 (90 BASE) MCG/ACT IN AERS: 2.0000 | INHALATION_SPRAY | Freq: Four times a day (QID) | RESPIRATORY_TRACT | Status: DC | PRN

## 2014-10-09 MED ORDER — GUAIFENESIN-CODEINE 100-10 MG/5ML PO SYRP: 5.0000 mL | ORAL_SOLUTION | Freq: Every evening | ORAL | Status: DC | PRN

## 2014-10-09 MED ORDER — AZITHROMYCIN 250 MG PO TABS: ORAL_TABLET | ORAL | Status: DC

## 2014-10-09 NOTE — Assessment & Plan Note (Signed)
In smoker and abnormal lung exam - cover with azithromycin.  Provided with albuterol inhaler for RAD and discussed administration. cheratussin for cough at night time. Encouraged smoking cessation, discussed concerns with COPD development. Update PCP if not improving later this week. Pt agrees with plan.

## 2014-10-09 NOTE — Progress Notes (Signed)
   BP 150/72 mmHg  Pulse 84  Temp(Src) 99.1 F (37.3 C)  Wt 193 lb (87.544 kg)  SpO2 95%   CC: cough  Subjective:    Patient ID: Amy Holmes, female    DOB: [birth year], 59 y.o.   MRN: [id number]  HPI: Amy Holmes is a 59 y.o. female presenting on 10/09/2014 for Cough   8d h/o cough, worse at night time when she lays down to rest. Last few nights sleeping in recliner. + ST and chest congestion. abd muscles sore from coughing. Main concern is trouble sleeping at night time. Feels fatigued. Some wheezing.  No fevers/chills, congestion, ear or tooth pain, headaches. No dyspnea.  Treating with mucinex which helps.  + sick contacts - husband recently but he improved quickly. Smoker - 4-5cig/day No h/o asthma or COPD H/o insomnia  Relevant past medical, surgical, family and social history reviewed and updated as indicated. Interim medical history since our last visit reviewed. Allergies and medications reviewed and updated. No current outpatient prescriptions on file prior to visit.   No current facility-administered medications on file prior to visit.   Past Medical History  Diagnosis Date  . Depression     Review of Systems Per HPI unless specifically indicated above     Objective:    BP 150/72 mmHg  Pulse 84  Temp(Src) 99.1 F (37.3 C)  Wt 193 lb (87.544 kg)  SpO2 95%  Wt Readings from Last 3 Encounters:  10/09/14 193 lb (87.544 kg)  09/29/14 193 lb 6.4 oz (87.726 kg)  04/02/14 192 lb (87.091 kg)    Physical Exam  Constitutional: She appears well-developed and well-nourished. No distress.  HENT:  Head: Normocephalic and atraumatic.  Right Ear: Hearing, tympanic membrane, external ear and ear canal normal.  Left Ear: Hearing, tympanic membrane, external ear and ear canal normal.  Nose: No mucosal edema or rhinorrhea. Right sinus exhibits no maxillary sinus tenderness and no frontal sinus tenderness. Left sinus exhibits no maxillary sinus tenderness and no  frontal sinus tenderness.  Mouth/Throat: Uvula is midline, oropharynx is clear and moist and mucous membranes are normal. No oropharyngeal exudate, posterior oropharyngeal edema, posterior oropharyngeal erythema or tonsillar abscesses.  Eyes: Conjunctivae and EOM are normal. Pupils are equal, round, and reactive to light. No scleral icterus.  Neck: Normal range of motion. Neck supple.  Cardiovascular: Normal rate, regular rhythm, normal heart sounds and intact distal pulses.   No murmur heard. Pulmonary/Chest: Effort normal. No respiratory distress. She has decreased breath sounds. She has wheezes (exp wheezing througout). She has rhonchi (bibasilarly). She has no rales.  Coarse throughout  Lymphadenopathy:    She has no cervical adenopathy.  Skin: Skin is warm and dry. No rash noted.  Nursing note and vitals reviewed.      Assessment & Plan:   Problem List Items Addressed This Visit    Smoker    Encouraged cessation. Pt seems contemplative.      Acute bronchitis with bronchospasm - Primary    In smoker and abnormal lung exam - cover with azithromycin.  Provided with albuterol inhaler for RAD and discussed administration. cheratussin for cough at night time. Encouraged smoking cessation, discussed concerns with COPD development. Update PCP if not improving later this week. Pt agrees with plan.          Follow up plan: Return if symptoms worsen or fail to improve.

## 2014-10-09 NOTE — Patient Instructions (Signed)
You have a bronchitis with wheezing. Treat with zpack antibiotic, albuterol inhaler, continue mucinex with plenty of water to help mobilize mucous out, and codeine cough syrup at night time. Push fluids and rest. Let us know if worsening wheezing or shortness of breath, or fever or worsening cough. Work on quitting smoking.

## 2014-10-09 NOTE — Assessment & Plan Note (Signed)
Encouraged cessation. Pt seems contemplative.

## 2014-10-14 ENCOUNTER — Ambulatory Visit (INDEPENDENT_AMBULATORY_CARE_PROVIDER_SITE_OTHER)
Admission: RE | Admit: 2014-10-14 | Discharge: 2014-10-14 | Disposition: A | Discharge status: Home / Self Care | Payer: BLUE CROSS/BLUE SHIELD | Source: Ambulatory Visit | Attending: Family Medicine | Admitting: Family Medicine

## 2014-10-14 ENCOUNTER — Ambulatory Visit (INDEPENDENT_AMBULATORY_CARE_PROVIDER_SITE_OTHER): Payer: BLUE CROSS/BLUE SHIELD | Admitting: Family Medicine

## 2014-10-14 ENCOUNTER — Encounter: Payer: Self-pay | Admitting: Family Medicine

## 2014-10-14 VITALS — BP 120/82 | HR 80 | Temp 98.1°F | Ht 66.0 in | Wt 192.7 lb

## 2014-10-14 DIAGNOSIS — Z720 Tobacco use: Secondary | ICD-10-CM

## 2014-10-14 DIAGNOSIS — Z87891 Personal history of nicotine dependence: Secondary | ICD-10-CM

## 2014-10-14 DIAGNOSIS — J209 Acute bronchitis, unspecified: Secondary | ICD-10-CM

## 2014-10-14 MED ORDER — BENZONATATE 100 MG PO CAPS: 100.0000 mg | ORAL_CAPSULE | Freq: Two times a day (BID) | ORAL | Status: DC | PRN

## 2014-10-14 MED ORDER — PREDNISONE 20 MG PO TABS: 40.0000 mg | ORAL_TABLET | Freq: Every day | ORAL | Status: DC

## 2014-10-14 NOTE — Patient Instructions (Signed)
Go get chest xray  Prednisone as instructed starting today  Stop the hycodan if making you itch  Try tessalon perles  Follow up in 2 days if not better, sooner if worsening

## 2014-10-14 NOTE — Progress Notes (Signed)
Pre visit review using our clinic review tool, if applicable. No additional management support is needed unless otherwise documented below in the visit note. 

## 2014-10-14 NOTE — Progress Notes (Signed)
HPI:  Acute visit for:  URI: -started 1.5 -2 weeks ago -symptoms: thick nasal congestion, PND, cough, laryngitis, mild wheezing -seen in [facility] 4 days ago - treated with azithromycin -taking hycodan, musinex and azithromycin -husband had the same for 2 weeks -hx of 1/4 ppd for 30 years, quit 2 weeks ago -follow up in 2 days if not better -denies: immobiliy, travel, persistent fevers, CP  ROS: See pertinent positives and negatives per HPI.  Past Medical History  Diagnosis Date  . Depression     Past Surgical History  Procedure Laterality Date  . Lumbar disc surgery    . Tubal ligation    . Coronary artery bypass graft      Family History  Problem Relation Age of Onset  . Heart disease Mother   . Diabetes Mother   . Depression Maternal Grandmother   . Depression Maternal Grandfather     History   Social History  . Marital Status: Divorced    Spouse Name: N/A    Number of Children: N/A  . Years of Education: N/A   Social History Main Topics  . Smoking status: Current Every Day Smoker  . Smokeless tobacco: None     Comment: 5-7 cig per day  . Alcohol Use: Yes     Comment: 1 drink here and there  . Drug Use: None  . Sexual Activity: None   Other Topics Concern  . None   Social History Narrative   Work or School:      Home Situation:      Spiritual Beliefs:      Lifestyle:              Current outpatient prescriptions:  .  albuterol (PROVENTIL HFA;VENTOLIN HFA) 108 (90 BASE) MCG/ACT inhaler, Inhale 2 puffs into the lungs every 6 (six) hours as needed for wheezing or shortness of breath., Disp: 1 Inhaler, Rfl: 1 .  GuaiFENesin (MUCINEX PO), Take by mouth., Disp: , Rfl:  .  guaiFENesin-codeine (ROBITUSSIN AC) 100-10 MG/5ML syrup, Take 5 mLs by mouth at bedtime as needed., Disp: 160 mL, Rfl: 0 .  benzonatate (TESSALON) 100 MG capsule, Take 1 capsule (100 mg total) by mouth 2 (two) times daily as needed for cough., Disp: 20 capsule, Rfl: 0 .  predniSONE  (DELTASONE) 20 MG tablet, Take 2 tablets (40 mg total) by mouth daily with breakfast., Disp: 8 tablet, Rfl: 0  EXAM:  Filed Vitals:   10/14/14 1514  BP: 120/82  Pulse: 80  Temp: 98.1 F (36.7 C)    Body mass index is 31.12 kg/(m^2).  GENERAL: vitals reviewed and listed above, alert, oriented, appears well hydrated and in no acute distress  HEENT: atraumatic, conjunttiva clear, no obvious abnormalities on inspection of external nose and ears, normal appearance of ear canals and TMs, clear nasal congestion, mild post oropharyngeal erythema with PND, no tonsillar edema or exudate, no sinus TTP  NECK: no obvious masses on inspection  LUNGS: difuse wheezing  CV: HRRR, no peripheral edema  MS: moves all extremities without noticeable abnormality  PSYCH: pleasant and cooperative, no obvious depression or anxiety  ASSESSMENT AND PLAN:  Discussed the following assessment and plan:  Acute bronchitis with bronchospasm - Plan: DG Chest 2 View, predniSONE (DELTASONE) 20 MG tablet, benzonatate (TESSALON) 100 MG capsule  Hx of smoking - Plan: DG Chest 2 View  -suspect viral infection with bronchitis, CXR given no improvement -prednisone -close follow up in 2 days if not improving, would consider CT if  not improving though other dx unlikely given sick contacts, symptoms, findings -Patient advised to return or notify a doctor immediately if symptoms worsen or persist or new concerns arise.  There are no Patient Instructions on file for this visit.   Amy Holmes R.

## 2014-10-15 ENCOUNTER — Other Ambulatory Visit: Payer: Self-pay | Admitting: *Deleted

## 2014-10-15 ENCOUNTER — Telehealth: Payer: Self-pay | Admitting: Family Medicine

## 2014-10-15 MED ORDER — MOXIFLOXACIN HCL 400 MG PO TABS: 400.0000 mg | ORAL_TABLET | Freq: Every day | ORAL | Status: DC

## 2014-10-15 NOTE — Telephone Encounter (Signed)
emmi emailed °

## 2014-10-21 ENCOUNTER — Encounter: Payer: Self-pay | Admitting: Family Medicine

## 2014-10-21 ENCOUNTER — Ambulatory Visit (INDEPENDENT_AMBULATORY_CARE_PROVIDER_SITE_OTHER): Payer: BLUE CROSS/BLUE SHIELD | Admitting: Family Medicine

## 2014-10-21 ENCOUNTER — Telehealth: Payer: Self-pay | Admitting: Family Medicine

## 2014-10-21 ENCOUNTER — Encounter: Payer: Self-pay | Admitting: *Deleted

## 2014-10-21 VITALS — BP 118/76 | HR 87 | Temp 98.1°F | Ht 66.0 in

## 2014-10-21 DIAGNOSIS — R0600 Dyspnea, unspecified: Secondary | ICD-10-CM

## 2014-10-21 DIAGNOSIS — J209 Acute bronchitis, unspecified: Secondary | ICD-10-CM

## 2014-10-21 DIAGNOSIS — R059 Cough, unspecified: Secondary | ICD-10-CM

## 2014-10-21 DIAGNOSIS — R05 Cough: Secondary | ICD-10-CM

## 2014-10-21 MED ORDER — PREDNISONE 10 MG PO TABS: ORAL_TABLET | ORAL | Status: DC

## 2014-10-21 NOTE — Telephone Encounter (Signed)
Please schedule appt today - earliest available

## 2014-10-21 NOTE — Telephone Encounter (Signed)
Pt was seen on 2-4 pt is still wheezing and having a cough. Please advise. Pt said md mention if she is no better go to er.

## 2014-10-21 NOTE — Progress Notes (Signed)
HPI:  Cough: -started approx 3-4 weeks ago with URI -treated in [facility] with azithromycin, here 2/4 exam c/w bronchitis and CXR showed bronchitis and possible developing pneumonia - tx with prednisone and avelox with improvement -now reports: felt great for about 4-5 days when on prednisone, but then 2 days ago started having nasal congestion, PND, cough, wheezing, mild SOB again - not worse then before -smoker -denies immobility, travel, fevers, CP, weight loss, hemoptysis  ROS: See pertinent positives and negatives per HPI.  Past Medical History  Diagnosis Date  . Depression     Past Surgical History  Procedure Laterality Date  . Lumbar disc surgery    . Tubal ligation    . Coronary artery bypass graft      Family History  Problem Relation Age of Onset  . Heart disease Mother   . Diabetes Mother   . Depression Maternal Grandmother   . Depression Maternal Grandfather     History   Social History  . Marital Status: Divorced    Spouse Name: N/A  . Number of Children: N/A  . Years of Education: N/A   Social History Main Topics  . Smoking status: Current Every Day Smoker  . Smokeless tobacco: Not on file     Comment: 5-7 cig per day  . Alcohol Use: Yes     Comment: 1 drink here and there  . Drug Use: Not on file  . Sexual Activity: Not on file   Other Topics Concern  . None   Social History Narrative   Work or School:      Home Situation:      Spiritual Beliefs:      Lifestyle:              Current outpatient prescriptions:  .  albuterol (PROVENTIL HFA;VENTOLIN HFA) 108 (90 BASE) MCG/ACT inhaler, Inhale 2 puffs into the lungs every 6 (six) hours as needed for wheezing or shortness of breath., Disp: 1 Inhaler, Rfl: 1 .  benzonatate (TESSALON) 100 MG capsule, Take 1 capsule (100 mg total) by mouth 2 (two) times daily as needed for cough., Disp: 20 capsule, Rfl: 0 .  GuaiFENesin (MUCINEX PO), Take by mouth., Disp: , Rfl:  .  guaiFENesin-codeine (ROBITUSSIN  AC) 100-10 MG/5ML syrup, Take 5 mLs by mouth at bedtime as needed., Disp: 160 mL, Rfl: 0 .  predniSONE (DELTASONE) 10 MG tablet, 40mg  for 3 days, then 20mg  (2 tabs) for 3 days, then 10 mg (1 tab) for 3 days, Disp: 21 tablet, Rfl: 0  EXAM:  Filed Vitals:   10/21/14 1423  BP: 118/76  Pulse: 87  Temp: 98.1 F (36.7 C)    There is no weight on file to calculate BMI.  GENERAL: vitals reviewed and listed above, alert, oriented, appears well hydrated and in no acute distress  HEENT: atraumatic, conjunttiva clear, no obvious abnormalities on inspection of external nose and ears, normal appearance of ear canals and TMs, clear nasal congestion, mild post oropharyngeal erythema with PND, no tonsillar edema or exudate, no sinus TTP  NECK: no obvious masses on inspection  LUNGS:  Scattered wheezes, no resp distress, O296% RA  CV: HRRR, no peripheral edema  MS: moves all extremities without noticeable abnormality  PSYCH: pleasant and cooperative, no obvious depression or anxiety  ASSESSMENT AND PLAN:  Discussed the following assessment and plan:  Acute bronchitis with bronchospasm - Plan: Ambulatory referral to Pulmonology  Dyspnea - Plan: Ambulatory referral to Pulmonology  Cough - Plan: Ambulatory referral  to Pulmonology  -persistent cough, wheezing, dypnea - CXR with bronchitis/? PNA - treated with aveloz. No fevers, no malaise or symptoms  To suggest ongoing bacterial infection. Improved the most on prednisone and wants to do more of this. Smoker and suspect underlying COPD with exacerbation, discussed other etiologies -offeredl labs/ct/pulm eval - she opted for pulm eval, prednisone taper acutely, CT/hosp eval if worsening -referral place and appt details provided -Patient advised to return or notify a doctor immediately if symptoms worsen or persist or new concerns arise.  There are no Patient Instructions on file for this visit.   Colin Benton R.

## 2014-10-21 NOTE — Telephone Encounter (Signed)
Spoke with pt and scheduled appt for today at 2:15pm.

## 2014-10-21 NOTE — Progress Notes (Signed)
Pre visit review using our clinic review tool, if applicable. No additional management support is needed unless otherwise documented below in the visit note. 

## 2014-11-01 ENCOUNTER — Institutional Professional Consult (permissible substitution): Payer: BLUE CROSS/BLUE SHIELD | Admitting: Internal Medicine

## 2014-11-09 ENCOUNTER — Encounter: Payer: Self-pay | Admitting: Internal Medicine

## 2014-11-09 ENCOUNTER — Ambulatory Visit (INDEPENDENT_AMBULATORY_CARE_PROVIDER_SITE_OTHER): Payer: BLUE CROSS/BLUE SHIELD | Admitting: Internal Medicine

## 2014-11-09 ENCOUNTER — Ambulatory Visit (INDEPENDENT_AMBULATORY_CARE_PROVIDER_SITE_OTHER)
Admission: RE | Admit: 2014-11-09 | Discharge: 2014-11-09 | Disposition: A | Discharge status: Home / Self Care | Payer: BLUE CROSS/BLUE SHIELD | Source: Ambulatory Visit | Attending: Internal Medicine | Admitting: Internal Medicine

## 2014-11-09 VITALS — BP 140/86 | HR 85 | Ht 66.5 in | Wt 192.6 lb

## 2014-11-09 DIAGNOSIS — R059 Cough, unspecified: Secondary | ICD-10-CM

## 2014-11-09 DIAGNOSIS — R05 Cough: Secondary | ICD-10-CM

## 2014-11-09 HISTORY — DX: Cough, unspecified: R05.9

## 2014-11-09 MED ORDER — FAMOTIDINE 20 MG PO TABS: ORAL_TABLET | ORAL | Status: DC

## 2014-11-09 MED ORDER — PANTOPRAZOLE SODIUM 40 MG PO TBEC: 40.0000 mg | DELAYED_RELEASE_TABLET | Freq: Every day | ORAL | Status: DC

## 2014-11-09 MED ORDER — TRAMADOL HCL 50 MG PO TABS: ORAL_TABLET | ORAL | Status: DC

## 2014-11-09 MED ORDER — PREDNISONE 10 MG PO TABS: ORAL_TABLET | ORAL | Status: DC

## 2014-11-09 NOTE — Patient Instructions (Addendum)
The key to effective treatment for your cough is eliminating the non-stop cycle of cough you're stuck in long enough to let your airway heal completely and then see if there is anything still making you cough once you stop the cough suppression, but this should take no more than 5 days to figure out  First take delsym two tsp every 12 hours and supplement if needed with  tramadol 50 mg up to 2 every 4 hours to suppress the urge to cough at all or even clear your throat. Swallowing water or using ice chips/non mint and menthol containing candies (such as lifesavers or sugarless jolly ranchers) are also effective.  You should rest your voice and avoid activities that you know make you cough.  Once you have eliminated the cough for 3 straight days try reducing the tramadol first,  then the delsym as tolerated.    Prednisone 10 mg take  4 each am x 2 days,   2 each am x 2 days,  1 each am x 2 days and stop (this is to eliminate allergies and inflammation from coughing)  Protonix (pantoprazole) Take 30-60 min before first meal of the day and Pepcid 20 mg one bedtime plus chlorpheniramine 4 mg x 2 at bedtime (both available over the counter)  until cough is completely gone for at least a week without the need for cough suppression  For drainage take chlortrimeton (chlorpheniramine) 4 mg every 4 hours available over the counter (may cause drowsiness)   GERD (REFLUX)  is an extremely common cause of respiratory symptoms, many times with no significant heartburn at all.    It can be treated with medication, but also with lifestyle changes including avoidance of late meals, excessive alcohol, smoking cessation, and avoid fatty foods, chocolate, peppermint, colas, red wine, and acidic juices such as orange juice.  NO MINT OR MENTHOL PRODUCTS SO NO COUGH DROPS  USE HARD CANDY INSTEAD (jolley ranchers or Stover's or Lifesavers (all available in sugarless versions) NO OIL BASED VITAMINS - use powdered  substitutes.    Please remember to go to the  x-ray department downstairs for your tests - we will call you with the results when they are available.     If not better next week return to clinic

## 2014-11-09 NOTE — Progress Notes (Signed)
Subjective:    Patient ID: Amy Holmes, female    DOB: [birth year],    MRN: [id number]  HPI   35 yowf quit smoking 08/2014  With a h/o year round watery rhinitis controlled best with allegra acute onset cough mid Jan 2016  referred to pulmonary clinic 11/09/2014 by Dr Maudie Mercury for refractory cough    11/09/2014 1st [facility] office visit/ Amy Holmes   Chief Complaint  Patient presents with  . Pulmonary Consult    Referred by Dr. Maudie Mercury. Pt states that she had bronchitis 4 wks ago and cough is still lingering. Her cough is worse at night and is occ prod with minimal clear to yellow sputum.   acutely ran fever but resolved p z pak  Cough is worst around 2 am / better p glass water  tessilon doesn't help, codeine does. Inhaler doesn't help cough and not really sob so not using it much   No obvious other patterns in day to day or daytime variabilty or assoc sob  or cp or chest tightness, subjective wheeze overt sinus or hb symptoms. No unusual exp hx or h/o childhood pna/ asthma or knowledge of premature birth.  Sleeping ok without nocturnal  or early am exacerbation  of respiratory  c/o's or need for noct saba. Also denies any obvious fluctuation of symptoms with weather or environmental changes or other aggravating or alleviating factors except as outlined above   Current Medications, Allergies, Complete Past Medical History, Past Surgical History, Family History, and Social History were reviewed in [employer] record.             Review of Systems  Constitutional: Negative for fever, chills and unexpected weight change.  HENT: Positive for congestion. Negative for dental problem, ear pain, nosebleeds, postnasal drip, rhinorrhea, sinus pressure, sneezing, sore throat, trouble swallowing and voice change.   Eyes: Negative for visual disturbance.  Respiratory: Positive for cough. Negative for choking and shortness of breath.   Cardiovascular: Negative for chest pain  and leg swelling.  Gastrointestinal: Negative for vomiting, abdominal pain and diarrhea.  Genitourinary: Negative for difficulty urinating.  Musculoskeletal: Negative for arthralgias.  Skin: Negative for rash.  Neurological: Negative for tremors, syncope and headaches.  Hematological: Does not bruise/bleed easily.       Objective:   Physical Exam  amb wf nad with freq throat clearing  Wt Readings from Last 3 Encounters:  11/09/14 192 lb 9.6 oz (87.363 kg)  10/14/14 192 lb 11.2 oz (87.408 kg)  10/09/14 193 lb (87.544 kg)    Vital signs reviewed  HEENT: nl dentition, turbinates, and orophanx. Nl external ear canals without cough reflex   NECK :  without JVD/Nodes/TM/ nl carotid upstrokes bilaterally   LUNGS: no acc muscle use, clear to A and P bilaterally without cough on insp or exp maneuvers   CV:  RRR  no s3 or murmur or increase in P2, no edema   ABD:  soft and nontender with nl excursion in the supine position. No bruits or organomegaly, bowel sounds nl  MS:  warm without deformities, calf tenderness, cyanosis or clubbing  SKIN: warm and dry without lesions    NEURO:  alert, approp, no deficits         CXR PA and Lateral:   11/09/2014 :     I personally reviewed images and agree with radiology impression as follows:    Interim clearing of pulmonary infiltrates. No acute cardiopulmonary disease.    Assessment &  Plan:

## 2014-11-09 NOTE — Progress Notes (Signed)
Quick Note:  LMTCB ______ 

## 2014-11-10 ENCOUNTER — Encounter: Payer: Self-pay | Admitting: Internal Medicine

## 2014-11-10 ENCOUNTER — Telehealth: Payer: Self-pay | Admitting: Internal Medicine

## 2014-11-10 NOTE — Telephone Encounter (Signed)
Notes Recorded by Tanda Rockers, MD on 11/09/2014 at 4:43 PM Call pt: Reviewed cxr and no acute change so no change in recommendations made at ov -- Pt is aware of results.

## 2014-11-10 NOTE — Assessment & Plan Note (Addendum)
The most common causes of chronic cough in immunocompetent adults include the following: upper airway cough syndrome (UACS), previously referred to as postnasal drip syndrome (PNDS), which is caused by variety of rhinosinus conditions; (2) asthma; (3) GERD; (4) chronic bronchitis from cigarette smoking or other inhaled environmental irritants; (5) nonasthmatic eosinophilic bronchitis; and (6) bronchiectasis.   These conditions, singly or in combination, have accounted for up to 94% of the causes of chronic cough in prospective studies.   Other conditions have constituted no >6% of the causes in prospective studies These have included bronchogenic carcinoma, chronic interstitial pneumonia, sarcoidosis, left ventricular failure, ACEI-induced cough, and aspiration from a condition associated with pharyngeal dysfunction.    Chronic cough is often simultaneously caused by more than one condition. A single cause has been found from 38 to 82% of the time, multiple causes from 18 to 62%. Multiply caused cough has been the result of three diseases up to 42% of the time.       Based on hx and exam, this is most likely not copd/ nor CB but rather  Classic Upper airway cough syndrome, so named because it's frequently impossible to sort out how much is  CR/sinusitis with freq throat clearing (which can be related to primary GERD)   vs  causing  secondary (" extra esophageal")  GERD from wide swings in gastric pressure that occur with throat clearing, often  promoting self use of mint and menthol lozenges that reduce the lower esophageal sphincter tone and exacerbate the problem further in a cyclical fashion.   These are the same pts (now being labeled as having "irritable larynx syndrome" by some cough centers) who not infrequently have a history of having failed to tolerate ace inhibitors,  dry powder inhalers or biphosphonates or report having atypical reflux symptoms that don't respond to standard doses of PPI ,  and are easily confused as having aecopd or asthma flares by even experienced allergists/ pulmonologists.   The first step is to maximize acid suppression and eliminate cyclical coughing then regroup if the cough persists.  See instructions for specific recommendations which were reviewed directly with the patient who was given a copy with highlighter outlining the key components.

## 2015-01-28 ENCOUNTER — Encounter: Payer: Self-pay | Admitting: *Deleted

## 2015-02-11 ENCOUNTER — Telehealth: Payer: Self-pay | Admitting: *Deleted

## 2015-02-11 ENCOUNTER — Encounter: Payer: Self-pay | Admitting: Family Medicine

## 2015-02-11 ENCOUNTER — Ambulatory Visit (INDEPENDENT_AMBULATORY_CARE_PROVIDER_SITE_OTHER): Payer: BLUE CROSS/BLUE SHIELD | Admitting: Family Medicine

## 2015-02-11 VITALS — BP 126/88 | HR 82 | Temp 97.9°F | Ht 66.5 in | Wt 199.3 lb

## 2015-02-11 DIAGNOSIS — R609 Edema, unspecified: Secondary | ICD-10-CM

## 2015-02-11 MED ORDER — MOMETASONE FUROATE 50 MCG/ACT NA SUSP: 2.0000 | Freq: Every day | NASAL | Status: DC

## 2015-02-11 NOTE — Telephone Encounter (Signed)
Patient spoke with the pharmacy while here in the office and they will cancel the Rx.

## 2015-02-11 NOTE — Patient Instructions (Addendum)
BEFORE YOU KNOW: -schedule your physical in July  We recommend the following healthy lifestyle measures: - eat a healthy diet consisting of lots of vegetables, fruits, beans, nuts, seeds, healthy meats such as white chicken and fish   - avoid fried foods, sweets, starches, fast food, processed foods, sodas, red meet and other fattening foods.  - get a least 150 minutes of aerobic exercise per week.   Elevate legs 30 minutes daily  Compression socks if you wish - you can buy these on amazon  Follow up sooner if symptoms persist or worsen or new concerns

## 2015-02-11 NOTE — Progress Notes (Signed)
Pre visit review using our clinic review tool, if applicable. No additional management support is needed unless otherwise documented below in the visit note. 

## 2015-02-11 NOTE — Progress Notes (Signed)
HPI:  Bilat leg swelling: -took along car ride over the weekend -had some mild  bilateral ankle swelling -now improving -she ate a lot of processed food and salt on her trip -denies: leg pain, SOB, redness of legs, DOE, orthopnea -she has hx of mild swelling in the past intermittently -hx of L ankle injury -mother has CHF  ROS: See pertinent positives and negatives per HPI.  Past Medical History  Diagnosis Date  . Obesity   . Tobacco abuse   . Palpitations   . Anxiety and depression     Past Surgical History  Procedure Laterality Date  . Lumbar disc surgery    . Tubal ligation      Family History  Problem Relation Age of Onset  . Heart disease Mother   . Diabetes Mother   . Depression Maternal Grandmother   . Depression Maternal Grandfather   . Asthma Son   . Asthma Daughter     History   Social History  . Marital Status: Divorced    Spouse Name: N/A  . Number of Children: N/A  . Years of Education: N/A   Occupational History  . HR     Social History Main Topics  . Smoking status: Former Smoker -- 0.25 packs/day for 20 years    Types: Cigarettes    Quit date: 08/10/2014  . Smokeless tobacco: Not on file  . Alcohol Use: 0.0 oz/week    0 Standard drinks or equivalent per week     Comment: 1 drink here and there  . Drug Use: Not on file  . Sexual Activity: Not on file   Other Topics Concern  . None   Social History Narrative   Work or School:      Home Situation:      Spiritual Beliefs:      Lifestyle:              Current outpatient prescriptions:  .  albuterol (PROVENTIL HFA;VENTOLIN HFA) 108 (90 BASE) MCG/ACT inhaler, Inhale 2 puffs into the lungs every 6 (six) hours as needed for wheezing or shortness of breath., Disp: , Rfl:   EXAM:  Filed Vitals:   02/11/15 0923  BP: 126/88  Pulse: 82  Temp: 97.9 F (36.6 C)    Body mass index is 31.69 kg/(m^2).  GENERAL: vitals reviewed and listed above, alert, oriented, appears well  hydrated and in no acute distress  HEENT: atraumatic, conjunttiva clear, no obvious abnormalities on inspection of external nose and ears  NECK: no obvious masses on inspection, no JVD  LUNGS: clear to auscultation bilaterally, no wheezes, rales or rhonchi, good air movement  CV: HRRR, tr bilat peripheral edema, no redness, symetric, no TTP over deep veins  MS: moves all extremities without noticeable abnormality  PSYCH: pleasant and cooperative, no obvious depression or anxiety  ASSESSMENT AND PLAN:  Discussed the following assessment and plan:  Edema  -suspect mild venous stasis -discussed other etiologies -follow up as needed if worsening or persists -Patient advised to return or notify a doctor immediately if symptoms worsen or persist or new concerns arise.  Patient Instructions  BEFORE YOU KNOW: -schedule your physical in July  We recommend the following healthy lifestyle measures: - eat a healthy diet consisting of lots of vegetables, fruits, beans, nuts, seeds, healthy meats such as white chicken and fish   - avoid fried foods, sweets, starches, fast food, processed foods, sodas, red meet and other fattening foods.  - get a least 150 minutes  of aerobic exercise per week.   Elevate legs 30 minutes daily  Compression socks if you wish - you can buy these on amazon  Follow up sooner if symptoms persist or worsen or new concerns      Amy Holmes R.

## 2015-03-15 ENCOUNTER — Telehealth: Payer: Self-pay | Admitting: Family Medicine

## 2015-03-15 DIAGNOSIS — L570 Actinic keratosis: Secondary | ICD-10-CM

## 2015-03-15 NOTE — Telephone Encounter (Addendum)
Pt needs a referral to skin surgery center for keratosis under her eye. Pt has bcbs.

## 2015-03-15 NOTE — Telephone Encounter (Signed)
Ok to refer if insurance requires.

## 2015-03-16 NOTE — Telephone Encounter (Signed)
Order placed and I left a detailed message at the pts cell number with this information and that someone will give her a call with appt info.

## 2015-12-10 DIAGNOSIS — C801 Malignant (primary) neoplasm, unspecified: Secondary | ICD-10-CM

## 2015-12-10 HISTORY — DX: Malignant (primary) neoplasm, unspecified: C80.1

## 2016-01-02 ENCOUNTER — Ambulatory Visit: Payer: 59 | Attending: Gynecologic Oncology | Admitting: Gynecologic Oncology

## 2016-01-02 ENCOUNTER — Encounter: Payer: Self-pay | Admitting: Gynecologic Oncology

## 2016-01-02 VITALS — BP 180/73 | HR 77 | Temp 98.0°F | Resp 18 | Ht 66.5 in | Wt 204.4 lb

## 2016-01-02 DIAGNOSIS — F329 Major depressive disorder, single episode, unspecified: Secondary | ICD-10-CM | POA: Diagnosis not present

## 2016-01-02 DIAGNOSIS — E669 Obesity, unspecified: Secondary | ICD-10-CM | POA: Diagnosis not present

## 2016-01-02 DIAGNOSIS — Z87891 Personal history of nicotine dependence: Secondary | ICD-10-CM | POA: Insufficient documentation

## 2016-01-02 DIAGNOSIS — R002 Palpitations: Secondary | ICD-10-CM | POA: Insufficient documentation

## 2016-01-02 DIAGNOSIS — C541 Malignant neoplasm of endometrium: Secondary | ICD-10-CM

## 2016-01-02 DIAGNOSIS — F419 Anxiety disorder, unspecified: Secondary | ICD-10-CM | POA: Insufficient documentation

## 2016-01-02 NOTE — Patient Instructions (Signed)
Preparing for your Surgery  Plan for surgery on May 9 with Dr. Janie Morning.  You will be scheduled for a robotic assisted total hysterectomy, bilateral salpingo-oophorectomy, sentinel lymph node biopsy.  Pre-operative Testing -You will receive a phone call from presurgical testing at [facility] to arrange for a pre-operative testing appointment before your surgery.  This appointment normally occurs one to two weeks before your scheduled surgery.   -Bring your insurance card, copy of an advanced directive if applicable, medication list  -At that visit, you will be asked to sign a consent for a possible blood transfusion in case a transfusion becomes necessary during surgery.  The need for a blood transfusion is rare but having consent is a necessary part of your care.     -You should not be taking blood thinners or aspirin at least ten days prior to surgery unless instructed by your surgeon.  Day Before Surgery at [facility] will be asked to take in a light diet the day before surgery.  Avoid carbonated beverages.  You will be advised to have nothing to eat or drink after midnight the evening before.     Eat a light diet the day before surgery.  Examples including soups, broths,  toast, yogurt, mashed potatoes.  Things to avoid include carbonated beverages  (fizzy beverages), raw fruits and raw vegetables, or beans.    If your bowels are filled with gas, your surgeon will have difficulty  visualizing your pelvic organs which increases your surgical risks.  Your role in recovery Your role is to become active as soon as directed by your doctor, while still giving yourself time to heal.  Rest when you feel tired. You will be asked to do the following in order to speed your recovery:  - Cough and breathe deeply. This helps toclear and expand your lungs and can prevent pneumonia. You may be given a spirometer to practice deep breathing. A staff member will show you how to  use the spirometer. - Do mild physical activity. Walking or moving your legs help your circulation and body functions return to normal. A staff member will help you when you try to walk and will provide you with simple exercises. Do not try to get up or walk alone the first time. - Actively manage your pain. Managing your pain lets you move in comfort. We will ask you to rate your pain on a scale of zero to 10. It is your responsibility to tell your doctor or nurse where and how much you hurt so your pain can be treated.  Special Considerations -If you are diabetic, you may be placed on insulin after surgery to have closer control over your blood sugars to promote healing and recovery.  This does not mean that you will be discharged on insulin.  If applicable, your oral antidiabetics will be resumed when you are tolerating a solid diet.  -Your final pathology results from surgery should be available by the Friday after surgery and the results will be relayed to you when available.  Blood Transfusion Information WHAT IS A BLOOD TRANSFUSION? A transfusion is the replacement of blood or some of its parts. Blood is made up of multiple cells which provide different functions.  Red blood cells carry oxygen and are used for blood loss replacement.  White blood cells fight against infection.  Platelets control bleeding.  Plasma helps clot blood.  Other blood products are available for specialized needs, such as hemophilia or other clotting  disorders. BEFORE THE TRANSFUSION  Who gives blood for transfusions?   You may be able to donate blood to be used at a later date on yourself (autologous donation).  Relatives can be asked to donate blood. This is generally not any safer than if you have received blood from a stranger. The same precautions are taken to ensure safety when a relative's blood is donated.  Healthy volunteers who are fully evaluated to make sure their blood is safe. This is blood  bank blood. Transfusion therapy is the safest it has ever been in the practice of medicine. Before blood is taken from a donor, a complete history is taken to make sure that person has no history of diseases nor engages in risky social behavior (examples are intravenous drug use or sexual activity with multiple partners). The donor's travel history is screened to minimize risk of transmitting infections, such as malaria. The donated blood is tested for signs of infectious diseases, such as HIV and hepatitis. The blood is then tested to be sure it is compatible with you in order to minimize the chance of a transfusion reaction. If you or a relative donates blood, this is often done in anticipation of surgery and is not appropriate for emergency situations. It takes many days to process the donated blood. RISKS AND COMPLICATIONS Although transfusion therapy is very safe and saves many lives, the main dangers of transfusion include:   Getting an infectious disease.  Developing a transfusion reaction. This is an allergic reaction to something in the blood you were given. Every precaution is taken to prevent this. The decision to have a blood transfusion has been considered carefully by your caregiver before blood is given. Blood is not given unless the benefits outweigh the risks.

## 2016-01-02 NOTE — Progress Notes (Signed)
Consult Note: Gyn-Onc  Consult was requested by Dr. Julien Girt for the evaluation of Amy Holmes 60 y.o. female  CC:  Chief Complaint  Patient presents with  . endometrial cancer    Assessment/Plan:  [name]  is a 60 y.o.  year old with grade 1 endometrioid endometrial cancer.   A detailed discussion was held with the patient and her family with regard to to her endometrial cancer diagnosis. We discussed the standard management options for uterine cancer which includes surgery followed possibly by adjuvant therapy depending on the results of surgery. The options for surgical management include a hysterectomy and removal of the tubes and ovaries possibly with removal of pelvic and para-aortic lymph nodes. A minimally invasive approach including a robotic hysterectomy or laparoscopic hysterectomy have benefits including shorter hospital stay, recovery time and better wound healing. The alternative approach is an open hysterectomy. The patient has been counseled about these surgical options and the risks of surgery in general including infection, bleeding, damage to surrounding structures (including bowel, bladder, ureters, nerves or vessels), and the postoperative risks of PE/ DVT, and lymphedema. I extensively reviewed the additional risks of robotic hysterectomy including possible need for conversion to open laparotomy.  I discussed positioning during surgery of trendelenberg and risks of minor facial swelling and care we take in preoperative positioning.  After counseling and consideration of her options, she desires to proceed with robotic assisted total hysterectomy, BSO, sentinel lymph node biopsy.   She will be seen by anesthesia for preoperative clearance and discussion of postoperative pain management.  She was given the opportunity to ask questions, which were answered to her satisfaction, and she is agreement with the above mentioned plan of care.  She is desiring a surgical  date as soon as possible, therefore I have scheduled her to have surgery with my partner on 01/17/16 in order to expedite things.   HPI: Amy Holmes is a 60 year old G3P3 who is seen in consultation at the request of Dr Julien Girt for grade 1 endometrial cancer. The patient had her first episode of postmenopausal bleeding in January 2016. This was worked up with a pap smear that was normal. She had another episode of spotting in 2016, and then again in March, 2017. At that time she presented to Dr Julien Girt office who performed an [place] which showed a normal size uterus but thickened endometrial stripe. A 1.3cm dermoid was identified on the left ovary.   A hysteroscopy and D&C was performed on 12/27/15 which showed grade 1 endometrial cancer.   She is otherwise very healthy. Her only prior abdominal surgeries are a laparoscopic cholecystectomy and tubal ligation. She has had 3 prior vaginal deliveries. Her BMI is 31.   Current Meds:  Outpatient Encounter Prescriptions as of 01/02/2016  Medication Sig  . [DISCONTINUED] albuterol (PROVENTIL HFA;VENTOLIN HFA) 108 (90 BASE) MCG/ACT inhaler Inhale 2 puffs into the lungs every 6 (six) hours as needed for wheezing or shortness of breath.   No facility-administered encounter medications on file as of 01/02/2016.    Allergy: No Known Allergies  Social Hx:   Social History   Social History  . Marital Status: Divorced    Spouse Name: N/A  . Number of Children: N/A  . Years of Education: N/A   Occupational History  . HR     Social History Main Topics  . Smoking status: Former Smoker -- 0.25 packs/day for 20 years    Types: Cigarettes    Quit  date: 08/10/2014  . Smokeless tobacco: Not on file  . Alcohol Use: 0.0 oz/week    0 Standard drinks or equivalent per week     Comment: 1 drink here and there  . Drug Use: Not on file  . Sexual Activity: Not on file   Other Topics Concern  . Not on file   Social History Narrative   Work or School:      Home  Situation:      Spiritual Beliefs:      Lifestyle:             Past Surgical Hx:  Past Surgical History  Procedure Laterality Date  . Lumbar disc surgery    . Tubal ligation      Past Medical Hx:  Past Medical History  Diagnosis Date  . Obesity   . Tobacco abuse   . Palpitations   . Anxiety and depression     Past Gynecological History:  SVD x 3  No LMP recorded. Patient is postmenopausal.  Family Hx:  Family History  Problem Relation Age of Onset  . Heart disease Mother   . Diabetes Mother   . Depression Maternal Grandmother   . Depression Maternal Grandfather   . Asthma Son   . Asthma Daughter     Review of Systems:  Constitutional  Feels well,    ENT Normal appearing ears and nares bilaterally Skin/Breast  No rash, sores, jaundice, itching, dryness Cardiovascular  No chest pain, shortness of breath, or edema  Pulmonary  No cough or wheeze.  Gastro Intestinal  No nausea, vomitting, or diarrhoea. No bright red blood per rectum, no abdominal pain, change in bowel movement, or constipation.  Genito Urinary  No frequency, urgency, dysuria, + postmenopausal bleeding Musculo Skeletal  No myalgia, arthralgia, joint swelling or pain  Neurologic  No weakness, numbness, change in gait,  Psychology  No depression, anxiety, insomnia.   Vitals:  Blood pressure 180/73, pulse 77, temperature 98 F (36.7 C), temperature source Oral, resp. rate 18, height 5' 6.5" (1.689 m), weight 204 lb 6.4 oz (92.715 kg), SpO2 100 %.  Physical Exam: WD in NAD Neck  Supple NROM, without any enlargements.  Lymph Node Survey No cervical supraclavicular or inguinal adenopathy Cardiovascular  Pulse normal rate, regularity and rhythm. S1 and S2 normal.  Lungs  Clear to auscultation bilateraly, without wheezes/crackles/rhonchi. Good air movement.  Skin  No rash/lesions/breakdown  Psychiatry  Alert and oriented to person, place, and time  Abdomen  Normoactive bowel sounds,  abdomen soft, non-tender and overweight without evidence of hernia.  Back No CVA tenderness Genito Urinary  Vulva/vagina: Normal external female genitalia.  No lesions. No discharge or bleeding.  Bladder/urethra:  No lesions or masses, well supported bladder  Vagina: normal  Cervix: Normal appearing, no lesions.  Uterus:  Small, mobile, no parametrial involvement or nodularity.  Adnexa: no palpable masses. Rectal  Good tone, no masses no cul de sac nodularity.  Extremities  No bilateral cyanosis, clubbing or edema.   Donaciano Eva, MD  01/02/2016, 5:12 PM

## 2016-01-06 ENCOUNTER — Telehealth: Payer: Self-pay | Admitting: Gynecologic Oncology

## 2016-01-06 NOTE — Telephone Encounter (Signed)
Patient had called [place] office stating she would need to move her surgery due to family issues to a later date that week.    Patient called back to the office after a message was left.  Informed her that her surgery was moved to May 11.  Advised she would receive a phone call from the Hospital for her pre-op appt.  Advised to call for any questions or concerns.

## 2016-01-10 NOTE — Patient Instructions (Addendum)
Amy Holmes  01/10/2016   Your procedure is scheduled on: 01-19-16  Report to [facility]  Entrance take [facility]  elevators to 3rd floor to  Amy Holmes at  630 AM.  Call this number if you have problems the morning of surgery [phone]   Remember: ONLY 1 PERSON MAY GO WITH YOU TO SHORT STAY TO GET  READY MORNING OF [place].   LIGHT DIET ALL DAY 01-18-16, SEE INSTRUCTIONS BELOW. Do not eat food or drink liquids :After Midnight.    Take these medicines the morning of surgery with A SIP OF WATER: LORATADINE                               You may not have any metal on your body including hair pins and              piercings  Do not wear jewelry, make-up, lotions, powders or perfumes, deodorant             Do not wear nail polish.  Do not shave  48 hours prior to surgery.              Men may shave face and neck.   Do not bring valuables to the hospital. [place].  Contacts, dentures or bridgework may not be worn into surgery.  Leave suitcase in the car. After surgery it may be brought to your room.                Please read over the following fact sheets you were given: _____________________________________________________________________             Eat a light diet the day before surgery.  Examples including soups, broths, toast, yogurt, mashed potatoes.  Things to avoid include carbonated beverages (fizzy beverages), raw fruits and raw vegetables, or beans.   If your bowels are filled with gas, your surgeon will have difficulty visualizing your pelvic organs which increases your surgical risks. - Preparing for Surgery Before surgery, you can play an important role.  Because skin is not sterile, your skin needs to be as free of germs as possible.  You can reduce the number of germs on your skin by washing with CHG (chlorahexidine gluconate) soap before surgery.  CHG is an antiseptic  cleaner which kills germs and bonds with the skin to continue killing germs even after washing. Please DO NOT use if you have an allergy to CHG or antibacterial soaps.  If your skin becomes reddened/irritated stop using the CHG and inform your nurse when you arrive at Short Stay. Do not shave (including legs and underarms) for at least 48 hours prior to the first CHG shower.  You may shave your face/neck. Please follow these instructions carefully:  1.  Shower with CHG Soap the night before surgery and the  morning of Surgery.  2.  If you choose to wash your hair, wash your hair first as usual with your  normal  shampoo.  3.  After you shampoo, rinse your hair and body thoroughly to remove the  shampoo.  4.  Use CHG as you would any other liquid soap.  You can apply chg directly  to the skin and wash                       Gently with a scrungie or clean washcloth.  5.  Apply the CHG Soap to your body ONLY FROM THE NECK DOWN.   Do not use on face/ open                           Wound or open sores. Avoid contact with eyes, ears mouth and genitals (private parts).                       Wash face,  Genitals (private parts) with your normal soap.             6.  Wash thoroughly, paying special attention to the area where your surgery  will be performed.  7.  Thoroughly rinse your body with warm water from the neck down.  8.  DO NOT shower/wash with your normal soap after using and rinsing off  the CHG Soap.                9.  Pat yourself dry with a clean towel.            10.  Wear clean pajamas.            11.  Place clean sheets on your bed the night of your first shower and do not  sleep with pets. Day of Surgery : Do not apply any lotions/deodorants the morning of surgery.  Please wear clean clothes to the hospital/surgery center.  FAILURE TO FOLLOW THESE INSTRUCTIONS MAY RESULT IN THE CANCELLATION OF YOUR SURGERY PATIENT  SIGNATURE_________________________________  NURSE SIGNATURE__________________________________  ________________________________________________________________________   Amy Holmes  An incentive spirometer is a tool that can help keep your lungs clear and active. This tool measures how well you are filling your lungs with each breath. Taking long deep breaths may help reverse or decrease the chance of developing breathing (pulmonary) problems (especially infection) following:  A long period of time when you are unable to move or be active. BEFORE THE PROCEDURE   If the spirometer includes an indicator to show your best effort, your nurse or respiratory therapist will set it to a desired goal.  If possible, sit up straight or lean slightly forward. Try not to slouch.  Hold the incentive spirometer in an upright position. INSTRUCTIONS FOR USE   Sit on the edge of your bed if possible, or sit up as far as you can in bed or on a chair.  Hold the incentive spirometer in an upright position.  Breathe out normally.  Place the mouthpiece in your mouth and seal your lips tightly around it.  Breathe in slowly and as deeply as possible, raising the piston or the ball toward the top of the column.  Hold your breath for 3-5 seconds or for as long as possible. Allow the piston or ball to fall to the bottom of the column.  Remove the mouthpiece from your mouth and breathe out normally.  Rest for a few seconds and repeat Steps 1 through 7 at least 10 times every 1-2 hours when you are awake. Take your time and take a few normal breaths between deep breaths.  The spirometer may include an indicator to show  your best effort. Use the indicator as a goal to work toward during each repetition.  After each set of 10 deep breaths, practice coughing to be sure your lungs are clear. If you have an incision (the cut made at the time of surgery), support your incision when coughing by placing a  pillow or rolled up towels firmly against it. Once you are able to get out of bed, walk around indoors and cough well. You may stop using the incentive spirometer when instructed by your caregiver.  RISKS AND COMPLICATIONS  Take your time so you do not get dizzy or light-headed.  If you are in pain, you may need to take or ask for pain medication before doing incentive spirometry. It is harder to take a deep breath if you are having pain. AFTER USE  Rest and breathe slowly and easily.  It can be helpful to keep track of a log of your progress. Your caregiver can provide you with a simple table to help with this. If you are using the spirometer at home, follow these instructions: [place] IF:   You are having difficultly using the spirometer.  You have trouble using the spirometer as often as instructed.  Your pain medication is not giving enough relief while using the spirometer.  You develop fever of 100.5 F (38.1 C) or higher. SEEK IMMEDIATE MEDICAL CARE IF:   You cough up bloody sputum that had not been present before.  You develop fever of 102 F (38.9 C) or greater.  You develop worsening pain at or near the incision site. MAKE SURE YOU:   Understand these instructions.  Will watch your condition.  Will get help right away if you are not doing well or get worse. Document Released: 01/07/2007 Document Revised: 11/19/2011 Document Reviewed: 03/10/2007 ExitCare Patient Information 2014 Exit[employer], [place].   ________________________________________________________________________  WHAT IS A BLOOD TRANSFUSION? Blood Transfusion Information  A transfusion is the replacement of blood or some of its parts. Blood is made up of multiple cells which provide different functions.  Red blood cells carry oxygen and are used for blood loss replacement.  White blood cells fight against infection.  Platelets control bleeding.  Plasma helps clot blood.  Other blood  products are available for specialized needs, such as hemophilia or other clotting disorders. BEFORE THE TRANSFUSION  Who gives blood for transfusions?   Healthy volunteers who are fully evaluated to make sure their blood is safe. This is blood bank blood. Transfusion therapy is the safest it has ever been in the practice of medicine. Before blood is taken from a donor, a complete history is taken to make sure that person has no history of diseases nor engages in risky social behavior (examples are intravenous drug use or sexual activity with multiple partners). The donor's travel history is screened to minimize risk of transmitting infections, such as malaria. The donated blood is tested for signs of infectious diseases, such as HIV and hepatitis. The blood is then tested to be sure it is compatible with you in order to minimize the chance of a transfusion reaction. If you or a relative donates blood, this is often done in anticipation of surgery and is not appropriate for emergency situations. It takes many days to process the donated blood. RISKS AND COMPLICATIONS Although transfusion therapy is very safe and saves many lives, the main dangers of transfusion include:   Getting an infectious disease.  Developing a transfusion reaction. This is an allergic reaction to  something in the blood you were given. Every precaution is taken to prevent this. The decision to have a blood transfusion has been considered carefully by your caregiver before blood is given. Blood is not given unless the benefits outweigh the risks. AFTER THE TRANSFUSION  Right after receiving a blood transfusion, you will usually feel much better and more energetic. This is especially true if your red blood cells have gotten low (anemic). The transfusion raises the level of the red blood cells which carry oxygen, and this usually causes an energy increase.  The nurse administering the transfusion will monitor you carefully for  complications. HOME CARE INSTRUCTIONS  No special instructions are needed after a transfusion. You may find your energy is better. Speak with your caregiver about any limitations on activity for underlying diseases you may have. SEEK MEDICAL CARE IF:   Your condition is not improving after your transfusion.  You develop redness or irritation at the intravenous (IV) site. SEEK IMMEDIATE MEDICAL CARE IF:  Any of the following symptoms occur over the next 12 hours:  Shaking chills.  You have a temperature by mouth above 102 F (38.9 C), not controlled by medicine.  Chest, back, or muscle pain.  People around you feel you are not acting correctly or are confused.  Shortness of breath or difficulty breathing.  Dizziness and fainting.  You get a rash or develop hives.  You have a decrease in urine output.  Your urine turns a dark color or changes to pink, red, or brown. Any of the following symptoms occur over the next 10 days:  You have a temperature by mouth above 102 F (38.9 C), not controlled by medicine.  Shortness of breath.  Weakness after normal activity.  The white part of the eye turns yellow (jaundice).  You have a decrease in the amount of urine or are urinating less often.  Your urine turns a dark color or changes to pink, red, or brown. Document Released: 08/24/2000 Document Revised: 11/19/2011 Document Reviewed: 04/12/2008 [facility] Patient Information 2014 [place], [place].  _______________________________________________________________________

## 2016-01-10 NOTE — Progress Notes (Signed)
HOLTER MONITOR REPORT 03-09-14 EPIC LOV DR Melvyn Novas PULMONARY 11-09-14 EPIC

## 2016-01-12 ENCOUNTER — Encounter (HOSPITAL_COMMUNITY): Payer: Self-pay

## 2016-01-12 ENCOUNTER — Encounter (HOSPITAL_COMMUNITY)
Admission: RE | Admit: 2016-01-12 | Discharge: 2016-01-12 | Disposition: A | Discharge status: Home / Self Care | Payer: 59 | Source: Ambulatory Visit | Attending: Gynecologic Oncology | Admitting: Gynecologic Oncology

## 2016-01-12 ENCOUNTER — Ambulatory Visit (HOSPITAL_COMMUNITY)
Admission: RE | Admit: 2016-01-12 | Discharge: 2016-01-12 | Disposition: A | Discharge status: Home / Self Care | Payer: 59 | Source: Ambulatory Visit | Attending: Gynecologic Oncology | Admitting: Gynecologic Oncology

## 2016-01-12 DIAGNOSIS — C541 Malignant neoplasm of endometrium: Secondary | ICD-10-CM | POA: Diagnosis present

## 2016-01-12 DIAGNOSIS — Z01818 Encounter for other preprocedural examination: Secondary | ICD-10-CM | POA: Diagnosis not present

## 2016-01-12 HISTORY — DX: Malignant (primary) neoplasm, unspecified: C80.1

## 2016-01-12 HISTORY — DX: Insomnia, unspecified: G47.00

## 2016-01-12 LAB — COMPREHENSIVE METABOLIC PANEL
ALBUMIN: 4.5 g/dL (ref 3.5–5.0)
ALT: 27 U/L (ref 14–54)
ANION GAP: 10 (ref 5–15)
AST: 20 U/L (ref 15–41)
Alkaline Phosphatase: 66 U/L (ref 38–126)
BUN: 19 mg/dL (ref 6–20)
CO2: 27 mmol/L (ref 22–32)
Calcium: 9.4 mg/dL (ref 8.9–10.3)
Chloride: 105 mmol/L (ref 101–111)
Creatinine, Ser: 0.72 mg/dL (ref 0.44–1.00)
GFR calc Af Amer: >60 mL/min (ref >60)
GFR calc non Af Amer: >60 mL/min (ref >60)
GLUCOSE: 111 mg/dL — AB (ref 65–99)
POTASSIUM: 4.2 mmol/L (ref 3.5–5.1)
SODIUM: 142 mmol/L (ref 135–145)
TOTAL PROTEIN: 7.2 g/dL (ref 6.5–8.1)
Total Bilirubin: 0.5 mg/dL (ref 0.3–1.2)

## 2016-01-12 LAB — CBC WITH DIFFERENTIAL/PLATELET
BASOS PCT: 1 %
Basophils Absolute: 0 10*3/uL (ref 0.0–0.1)
EOS ABS: 0.3 10*3/uL (ref 0.0–0.7)
EOS PCT: 5 %
HCT: 38.8 % (ref 36.0–46.0)
Hemoglobin: 12.5 g/dL (ref 12.0–15.0)
Lymphocytes Relative: 30 %
Lymphs Abs: 1.6 10*3/uL (ref 0.7–4.0)
MCH: 27.8 pg (ref 26.0–34.0)
MCHC: 32.2 g/dL (ref 30.0–36.0)
MCV: 86.2 fL (ref 78.0–100.0)
MONO ABS: 0.4 10*3/uL (ref 0.1–1.0)
MONOS PCT: 8 %
Neutro Abs: 3.1 10*3/uL (ref 1.7–7.7)
Neutrophils Relative %: 56 %
Platelets: 187 10*3/uL (ref 150–400)
RBC: 4.5 MIL/uL (ref 3.87–5.11)
RDW: 14 % (ref 11.5–15.5)
WBC: 5.4 10*3/uL (ref 4.0–10.5)

## 2016-01-12 LAB — URINE MICROSCOPIC-ADD ON

## 2016-01-12 LAB — URINALYSIS, ROUTINE W REFLEX MICROSCOPIC
BILIRUBIN URINE: NEGATIVE
GLUCOSE, UA: NEGATIVE mg/dL
KETONES UR: NEGATIVE mg/dL
Nitrite: NEGATIVE
PROTEIN: NEGATIVE mg/dL
Specific Gravity, Urine: 1.006 (ref 1.005–1.030)
pH: 6.5 (ref 5.0–8.0)

## 2016-01-12 LAB — ABO/RH: ABO/RH(D): O POS

## 2016-01-12 NOTE — Progress Notes (Signed)
[place] RESULTS ROUTED TO MELISSA CROSS NP INBASKET BY EPIC

## 2016-01-18 ENCOUNTER — Telehealth: Payer: Self-pay | Admitting: Gynecologic Oncology

## 2016-01-18 NOTE — Telephone Encounter (Signed)
Patient called wanting to know what she can take for sinus congestion.  Advised she can use saline nasal spray, zyrtec or claritin as needed only today.  Advised to call for any questions or concerns.

## 2016-01-18 NOTE — Anesthesia Preprocedure Evaluation (Addendum)
Anesthesia Evaluation  Patient identified by MRN, date of birth, ID band Patient awake    Reviewed: Allergy & Precautions, H&P , Patient's Chart, lab work & pertinent test results, reviewed documented beta blocker date and time   Airway Mallampati: II  TM Distance: >3 FB Neck ROM: full    Dental no notable dental hx. (+) Dental Advisory Given, Missing, Chipped   Pulmonary Current Smoker,    Pulmonary exam normal breath sounds clear to auscultation       Cardiovascular  Rhythm:regular Rate:Normal     Neuro/Psych    GI/Hepatic   Endo/Other    Renal/GU      Musculoskeletal   Abdominal   Peds  Hematology   Anesthesia Other Findings   Reproductive/Obstetrics                            Anesthesia Physical Anesthesia Plan  ASA: II  Anesthesia Plan: General   Post-op Pain Management:    Induction: Intravenous  Airway Management Planned: Oral ETT  Additional Equipment:   Intra-op Plan:   Post-operative Plan: Extubation in OR  Informed Consent: I have reviewed the patients History and Physical, chart, labs and discussed the procedure including the risks, benefits and alternatives for the proposed anesthesia with the patient or authorized representative who has indicated his/her understanding and acceptance.   Dental Advisory Given and Dental advisory given  Plan Discussed with: CRNA and Surgeon  Anesthesia Plan Comments: (  Discussed general anesthesia, including possible nausea, instrumentation of airway, sore throat,pulmonary aspiration, etc. I asked if the were any outstanding questions, or  concerns before we proceeded. )        Anesthesia Quick Evaluation

## 2016-01-19 ENCOUNTER — Ambulatory Visit (HOSPITAL_COMMUNITY)
Admission: RE | Admit: 2016-01-19 | Discharge: 2016-01-20 | Disposition: A | Discharge status: Home / Self Care | Payer: 59 | Source: Ambulatory Visit | Attending: Gynecologic Oncology | Admitting: Gynecologic Oncology

## 2016-01-19 ENCOUNTER — Encounter (HOSPITAL_COMMUNITY)
Admission: RE | Disposition: A | Discharge status: Home / Self Care | Payer: Self-pay | Source: Ambulatory Visit | Attending: Gynecologic Oncology

## 2016-01-19 ENCOUNTER — Ambulatory Visit (HOSPITAL_COMMUNITY): Payer: 59 | Admitting: Anesthesiology

## 2016-01-19 ENCOUNTER — Encounter (HOSPITAL_COMMUNITY): Payer: Self-pay | Admitting: *Deleted

## 2016-01-19 DIAGNOSIS — Z6831 Body mass index (BMI) 31.0-31.9, adult: Secondary | ICD-10-CM | POA: Insufficient documentation

## 2016-01-19 DIAGNOSIS — D259 Leiomyoma of uterus, unspecified: Secondary | ICD-10-CM | POA: Diagnosis not present

## 2016-01-19 DIAGNOSIS — E669 Obesity, unspecified: Secondary | ICD-10-CM | POA: Insufficient documentation

## 2016-01-19 DIAGNOSIS — C541 Malignant neoplasm of endometrium: Secondary | ICD-10-CM | POA: Insufficient documentation

## 2016-01-19 DIAGNOSIS — C542 Malignant neoplasm of myometrium: Secondary | ICD-10-CM | POA: Insufficient documentation

## 2016-01-19 DIAGNOSIS — C775 Secondary and unspecified malignant neoplasm of intrapelvic lymph nodes: Secondary | ICD-10-CM | POA: Diagnosis not present

## 2016-01-19 DIAGNOSIS — Z87891 Personal history of nicotine dependence: Secondary | ICD-10-CM | POA: Diagnosis not present

## 2016-01-19 HISTORY — PX: LYMPH NODE DISSECTION: SHX5087

## 2016-01-19 HISTORY — PX: ROBOTIC ASSISTED TOTAL HYSTERECTOMY WITH BILATERAL SALPINGO OOPHERECTOMY: SHX6086

## 2016-01-19 LAB — TYPE AND SCREEN
ABO/RH(D): O POS
ANTIBODY SCREEN: NEGATIVE

## 2016-01-19 SURGERY — HYSTERECTOMY, TOTAL, ROBOT-ASSISTED, LAPAROSCOPIC, WITH BILATERAL SALPINGO-OOPHORECTOMY
Anesthesia: General

## 2016-01-19 MED ORDER — ENOXAPARIN SODIUM 40 MG/0.4ML ~~LOC~~ SOLN
40.0000 mg | SUBCUTANEOUS | Status: AC
Start: 2016-01-19 — End: 2016-01-19
  Administered 2016-01-19: 40 mg via SUBCUTANEOUS
  Filled 2016-01-19: qty 0.4

## 2016-01-19 MED ORDER — HYDROMORPHONE HCL 1 MG/ML IJ SOLN: 0.2000 mg | INTRAMUSCULAR | Status: DC | PRN

## 2016-01-19 MED ORDER — PROPOFOL 10 MG/ML IV BOLUS
INTRAVENOUS | Status: AC
  Filled 2016-01-19: qty 20

## 2016-01-19 MED ORDER — GLYCOPYRROLATE 0.2 MG/ML IJ SOLN
INTRAMUSCULAR | Status: AC
  Filled 2016-01-19: qty 3

## 2016-01-19 MED ORDER — ONDANSETRON HCL 4 MG/2ML IJ SOLN
INTRAMUSCULAR | Status: DC | PRN
  Administered 2016-01-19: 4 mg via INTRAVENOUS

## 2016-01-19 MED ORDER — HYDROMORPHONE HCL 1 MG/ML IJ SOLN
INTRAMUSCULAR | Status: AC
  Filled 2016-01-19: qty 1

## 2016-01-19 MED ORDER — OXYMETAZOLINE HCL 0.05 % NA SOLN
NASAL | Status: AC
  Filled 2016-01-19: qty 15

## 2016-01-19 MED ORDER — ONDANSETRON HCL 4 MG/2ML IJ SOLN
INTRAMUSCULAR | Status: AC
  Filled 2016-01-19: qty 2

## 2016-01-19 MED ORDER — ENOXAPARIN SODIUM 40 MG/0.4ML ~~LOC~~ SOLN
40.0000 mg | SUBCUTANEOUS | Status: DC
  Filled 2016-01-19 (×2): qty 0.4

## 2016-01-19 MED ORDER — MIDAZOLAM HCL 2 MG/2ML IJ SOLN
INTRAMUSCULAR | Status: AC
  Filled 2016-01-19: qty 2

## 2016-01-19 MED ORDER — ROCURONIUM BROMIDE 50 MG/5ML IV SOLN
INTRAVENOUS | Status: AC
  Filled 2016-01-19: qty 1

## 2016-01-19 MED ORDER — FENTANYL CITRATE (PF) 250 MCG/5ML IJ SOLN: INTRAMUSCULAR | Status: DC | PRN

## 2016-01-19 MED ORDER — KCL IN DEXTROSE-NACL 20-5-0.45 MEQ/L-%-% IV SOLN
INTRAVENOUS | Status: DC
  Administered 2016-01-19 – 2016-01-20 (×2): via INTRAVENOUS
  Filled 2016-01-19 (×2): qty 1000

## 2016-01-19 MED ORDER — LACTATED RINGERS IV SOLN
INTRAVENOUS | Status: DC
  Administered 2016-01-19 (×3): via INTRAVENOUS

## 2016-01-19 MED ORDER — CEFAZOLIN SODIUM-DEXTROSE 2-4 GM/100ML-% IV SOLN
2.0000 g | INTRAVENOUS | Status: AC
  Administered 2016-01-19: 2 g via INTRAVENOUS
  Filled 2016-01-19: qty 100

## 2016-01-19 MED ORDER — GABAPENTIN 600 MG PO TABS
600.0000 mg | ORAL_TABLET | Freq: Every day | ORAL | Status: DC
  Filled 2016-01-19: qty 1

## 2016-01-19 MED ORDER — FENTANYL CITRATE (PF) 250 MCG/5ML IJ SOLN
INTRAMUSCULAR | Status: AC
  Filled 2016-01-19: qty 5

## 2016-01-19 MED ORDER — ONDANSETRON HCL 4 MG PO TABS: 4.0000 mg | ORAL_TABLET | Freq: Four times a day (QID) | ORAL | Status: DC | PRN

## 2016-01-19 MED ORDER — IBUPROFEN 800 MG PO TABS: 800.0000 mg | ORAL_TABLET | Freq: Three times a day (TID) | ORAL | Status: DC | PRN

## 2016-01-19 MED ORDER — FENTANYL CITRATE (PF) 250 MCG/5ML IJ SOLN
INTRAMUSCULAR | Status: DC | PRN
  Administered 2016-01-19 (×2): 50 ug via INTRAVENOUS
  Administered 2016-01-19: 100 ug via INTRAVENOUS
  Administered 2016-01-19 (×2): 50 ug via INTRAVENOUS
  Administered 2016-01-19: 100 ug via INTRAVENOUS

## 2016-01-19 MED ORDER — SUGAMMADEX SODIUM 200 MG/2ML IV SOLN
INTRAVENOUS | Status: AC
  Filled 2016-01-19: qty 2

## 2016-01-19 MED ORDER — LIDOCAINE HCL (CARDIAC) 20 MG/ML IV SOLN
INTRAVENOUS | Status: AC
  Filled 2016-01-19: qty 5

## 2016-01-19 MED ORDER — LIDOCAINE HCL (CARDIAC) 20 MG/ML IV SOLN
INTRAVENOUS | Status: DC | PRN
  Administered 2016-01-19: 40 mg via INTRAVENOUS

## 2016-01-19 MED ORDER — GABAPENTIN 300 MG PO CAPS
600.0000 mg | ORAL_CAPSULE | Freq: Every day | ORAL | Status: AC
  Administered 2016-01-19: 600 mg via ORAL
  Filled 2016-01-19: qty 2

## 2016-01-19 MED ORDER — MIDAZOLAM HCL 2 MG/2ML IJ SOLN
INTRAMUSCULAR | Status: DC | PRN
  Administered 2016-01-19: 2 mg via INTRAVENOUS

## 2016-01-19 MED ORDER — OXYMETAZOLINE HCL 0.05 % NA SOLN
1.0000 | Freq: Two times a day (BID) | NASAL | Status: DC
Start: 2016-01-19 — End: 2016-01-19
  Administered 2016-01-19: 1 via NASAL
  Administered 2016-01-19: 2 via NASAL
  Filled 2016-01-19: qty 15

## 2016-01-19 MED ORDER — ASPIRIN EC 81 MG PO TBEC
81.0000 mg | DELAYED_RELEASE_TABLET | Freq: Every day | ORAL | Status: DC
  Administered 2016-01-20: 81 mg via ORAL
  Filled 2016-01-19: qty 1

## 2016-01-19 MED ORDER — NEOSTIGMINE METHYLSULFATE 10 MG/10ML IV SOLN
INTRAVENOUS | Status: AC
  Filled 2016-01-19: qty 1

## 2016-01-19 MED ORDER — OXYCODONE-ACETAMINOPHEN 5-325 MG PO TABS: 1.0000 | ORAL_TABLET | ORAL | Status: DC | PRN

## 2016-01-19 MED ORDER — ROCURONIUM BROMIDE 100 MG/10ML IV SOLN
INTRAVENOUS | Status: DC | PRN
  Administered 2016-01-19: 10 mg via INTRAVENOUS
  Administered 2016-01-19: 30 mg via INTRAVENOUS
  Administered 2016-01-19 (×2): 5 mg via INTRAVENOUS

## 2016-01-19 MED ORDER — ONDANSETRON HCL 4 MG/2ML IJ SOLN: 4.0000 mg | Freq: Four times a day (QID) | INTRAMUSCULAR | Status: DC | PRN

## 2016-01-19 MED ORDER — KETOROLAC TROMETHAMINE 15 MG/ML IJ SOLN
15.0000 mg | Freq: Four times a day (QID) | INTRAMUSCULAR | Status: DC
  Administered 2016-01-20 (×2): 15 mg via INTRAVENOUS
  Filled 2016-01-19 (×4): qty 1

## 2016-01-19 MED ORDER — SUGAMMADEX SODIUM 200 MG/2ML IV SOLN
INTRAVENOUS | Status: DC | PRN
  Administered 2016-01-19: 200 mg via INTRAVENOUS

## 2016-01-19 MED ORDER — SUCCINYLCHOLINE CHLORIDE 20 MG/ML IJ SOLN
INTRAMUSCULAR | Status: DC | PRN
  Administered 2016-01-19: 100 mg via INTRAVENOUS

## 2016-01-19 MED ORDER — PROPOFOL 10 MG/ML IV BOLUS
INTRAVENOUS | Status: DC | PRN
  Administered 2016-01-19: 150 mg via INTRAVENOUS

## 2016-01-19 MED ORDER — CEFAZOLIN SODIUM-DEXTROSE 2-4 GM/100ML-% IV SOLN
INTRAVENOUS | Status: AC
  Filled 2016-01-19: qty 100

## 2016-01-19 MED ORDER — HYDROMORPHONE HCL 1 MG/ML IJ SOLN
0.2500 mg | INTRAMUSCULAR | Status: DC | PRN
  Administered 2016-01-19 (×4): 0.5 mg via INTRAVENOUS

## 2016-01-19 SURGICAL SUPPLY — 47 items
APPLICATOR SURGIFLO ENDO (HEMOSTASIS) IMPLANT
CHLORAPREP W/TINT 26ML (MISCELLANEOUS) ×2 IMPLANT
COVER SURGICAL LIGHT HANDLE (MISCELLANEOUS) ×2 IMPLANT
COVER TIP SHEARS 8 DVNC (MISCELLANEOUS) ×1 IMPLANT
COVER TIP SHEARS 8MM DA VINCI (MISCELLANEOUS) ×1
DRAPE ARM DVNC X/XI (DISPOSABLE) ×4 IMPLANT
DRAPE COLUMN DVNC XI (DISPOSABLE) ×1 IMPLANT
DRAPE DA VINCI XI ARM (DISPOSABLE) ×4
DRAPE DA VINCI XI COLUMN (DISPOSABLE) ×1
DRAPE SHEET LG 3/4 BI-LAMINATE (DRAPES) ×4 IMPLANT
DRAPE SURG IRRIG POUCH 19X23 (DRAPES) ×2 IMPLANT
ELECT REM PT RETURN 15FT ADLT (MISCELLANEOUS) ×2 IMPLANT
GLOVE BIO SURGEON STRL SZ 6 (GLOVE) ×8 IMPLANT
GLOVE BIO SURGEON STRL SZ 6.5 (GLOVE) ×4 IMPLANT
GOWN STRL REUS W/ TWL LRG LVL3 (GOWN DISPOSABLE) ×2 IMPLANT
GOWN STRL REUS W/TWL LRG LVL3 (GOWN DISPOSABLE) ×2
HOLDER FOLEY CATH W/STRAP (MISCELLANEOUS) ×2 IMPLANT
KIT BASIN OR (CUSTOM PROCEDURE TRAY) ×2 IMPLANT
LIQUID BAND (GAUZE/BANDAGES/DRESSINGS) ×2 IMPLANT
MANIPULATOR UTERINE 4.5 ZUMI (MISCELLANEOUS) ×2 IMPLANT
MARKER SKIN DUAL TIP RULER LAB (MISCELLANEOUS) ×2 IMPLANT
OBTURATOR XI 8MM BLADELESS (TROCAR) ×2 IMPLANT
OCCLUDER COLPOPNEUMO (BALLOONS) ×2 IMPLANT
PAD POSITIONING PINK XL (MISCELLANEOUS) ×2 IMPLANT
PORT ACCESS TROCAR AIRSEAL 12 (TROCAR) ×1 IMPLANT
PORT ACCESS TROCAR AIRSEAL 5M (TROCAR) ×1
POUCH ENDO CATCH II 15MM (MISCELLANEOUS) IMPLANT
POUCH SPECIMEN RETRIEVAL 10MM (ENDOMECHANICALS) IMPLANT
SEAL CANN UNIV 5-8 DVNC XI (MISCELLANEOUS) ×4 IMPLANT
SEAL XI 5MM-8MM UNIVERSAL (MISCELLANEOUS) ×4
SET TRI-LUMEN FLTR TB AIRSEAL (TUBING) ×2 IMPLANT
SET TUBE IRRIG SUCTION NO TIP (IRRIGATION / IRRIGATOR) ×2 IMPLANT
SHEET LAVH (DRAPES) ×2 IMPLANT
SOLUTION ELECTROLUBE (MISCELLANEOUS) ×2 IMPLANT
SURGIFLO W/THROMBIN 8M KIT (HEMOSTASIS) IMPLANT
SUT MNCRL AB 4-0 PS2 18 (SUTURE) ×4 IMPLANT
SUT VIC AB 0 CT1 27 (SUTURE) ×1
SUT VIC AB 0 CT1 27XBRD ANTBC (SUTURE) ×1 IMPLANT
SYR 50ML LL SCALE MARK (SYRINGE) ×2 IMPLANT
TOWEL OR 17X26 10 PK STRL BLUE (TOWEL DISPOSABLE) ×4 IMPLANT
TOWEL OR NON WOVEN STRL DISP B (DISPOSABLE) ×2 IMPLANT
TRAP SPECIMEN MUCOUS 40CC (MISCELLANEOUS) IMPLANT
TRAY FOLEY W/METER SILVER 14FR (SET/KITS/TRAYS/PACK) ×2 IMPLANT
TRAY LAPAROSCOPIC (CUSTOM PROCEDURE TRAY) ×2 IMPLANT
TROCAR BLADELESS OPT 5 100 (ENDOMECHANICALS) ×2 IMPLANT
UNDERPAD 30X30 (UNDERPADS AND DIAPERS) ×2 IMPLANT
WATER STERILE IRR 1500ML POUR (IV SOLUTION) ×4 IMPLANT

## 2016-01-19 NOTE — Op Note (Signed)
OPERATIVE NOTE 01/19/16  Surgeon: Donaciano Eva   Assistants: Dr Lahoma Crocker (an MD assistant was necessary for tissue manipulation, management of robotic instrumentation, retraction and positioning due to the complexity of the case and hospital policies).   Anesthesia: General endotracheal anesthesia  ASA Class: 3   Pre-operative Diagnosis: grade 1 endometrial cancer  Post-operative Diagnosis: same  Operation: Robotic-assisted laparoscopic total hysterectomy with bilateral salpingoophorectomy   Surgeon: Donaciano Eva  Assistant Surgeon: Lahoma Crocker MD  Anesthesia: GET  Urine Output: 100  Operative Findings:  : 8cm uterus, grossly normal. Normal tubes and ovaries. No lymph node mapping (sentinel) either side.  Estimated Blood Loss:  less than 50 mL      Total IV Fluids: 700 ml         Specimens: uterus, cervix, bilateral tubes and ovaries, left and right pelvic lymph nodes         Complications:  None; patient tolerated the procedure well.         Disposition: PACU - hemodynamically stable.  Procedure Details  The patient was seen in the Holding Room. The risks, benefits, complications, treatment options, and expected outcomes were discussed with the patient.  The patient concurred with the proposed plan, giving informed consent.  The site of surgery properly noted/marked. The patient was identified as Amy Holmes and the procedure verified as a Robotic-assisted hysterectomy with bilateral salpingo oophorectomy with sentinel lymph node mapping. A Time Out was held and the above information confirmed.  After induction of anesthesia, the patient was draped and prepped in the usual sterile manner. Pt was placed in supine position after anesthesia and draped and prepped in the usual sterile manner. The abdominal drape was placed after the CholoraPrep had been allowed to dry for 3 minutes.  Her arms were tucked to her side with all appropriate  precautions.  The shoulders were stabilized with padded shoulder blocks applied to the acromium processes.  The patient was placed in the semi-lithotomy position in [place].  The perineum was prepped with Betadine. The patient was then prepped. Foley catheter was placed.  A sterile speculum was placed in the vagina.  The cervix was grasped with a single-tooth tenaculum and dilated with Kennon Rounds dilators.  1mg  total of ICG was injected into the cervical stroma at 2 and 9 o'clock at a 37mm depth (concentration 0..5mg /ml). The ZUMI uterine manipulator with a medium colpotomizer ring was placed without difficulty.  A pneum occluder balloon was placed over the manipulator.  OG tube placement was confirmed and to suction.   Next, a 5 mm skin incision was made 1 cm below the subcostal margin in the midclavicular line.  The 5 mm Optiview port and scope was used for direct entry.  Opening pressure was under 10 mm CO2.  The abdomen was insufflated and the findings were noted as above.   At this point and all points during the procedure, the patient's intra-abdominal pressure did not exceed 15 mmHg. Next, a 10 mm skin incision was made in the umbilicus and a right and left port was placed about 10 cm lateral to the robot port on the right and left side.  A fourth arm was placed in the left lower quadrant 2 cm above and superior and medial to the anterior superior iliac spine.  All ports were placed under direct visualization.  The patient was placed in steep Trendelenburg.  Bowel was folded away into the upper abdomen.  The robot was docked in the  normal manner.  The right and left peritoneum were opened parallel to the IP ligament to open the retroperitoneal spaces bilaterally. The SLN mapping was performed in bilateral pelvic basins. The para rectal and paravesical spaces were opened up. No lymphatic channels were identified either side,  30 minutes transpired and no SLN's were identified therefore the SLN mapping was  aborted and a decision was made to perform lymphadenectomy.   The hysterectomy was started after the round ligament on the right side was incised and the retroperitoneum was entered and the pararectal space was developed.  The ureter was noted to be on the medial leaf of the broad ligament.  The peritoneum above the ureter was incised and stretched and the infundibulopelvic ligament was skeletonized, cauterized and cut.  The posterior peritoneum was taken down to the level of the KOH ring.  The anterior peritoneum was also taken down.  The bladder flap was created to the level of the KOH ring.  The uterine artery on the right side was skeletonized, cauterized and cut in the normal manner.  A similar procedure was performed on the left.  The colpotomy was made and the uterus, cervix, bilateral ovaries and tubes were amputated and delivered through the vagina.  Pedicles were inspected and excellent hemostasis was achieved.    The right lymphadenectomy (pelvic) was performed. The paravesical space was developed with monopolar and sharp dissection. It was held open with tension on the median umbilical ligament with the forth arm. The paravesical space was opened with blunt and sharp dissection to mobilize the ureter off of the medial surface of the internal iliac artery. The medial leaf of the broad ligament containing the ureter was held medially (opening the pararectal space) by the assistant's grasper. The right pelvic lymphadenectomy was performed by skeletonizing the internal iliac artery at the bifurcation with the external iliac artery. The obturator nerve was identified in the base of lateral paravesical space. The ureter was mobilized medially off of the dissection by developing the pararectal space. The genitofemoral nerve was identified, skeletonized and mobilized laterally off of the external iliac artery. An enbloc resection of lymph nodes was performed within the following boundaries: the mid portion  of the common iliac proximally, the circumflex iliac vein distally, the obturator nerve posteriorally, the genitofemoral nerve laterally. The nodal basin (including obturator space) were confirmed to be empty of nodes and hemostatic. The nodes were placed in an endocatch bag and retrieved vaginally.  The left pelvic lymphadenectomy was performed by first the paravesical space was developed with monopolar and sharp dissection. It was held open with tension on the median umbilical ligament with the forth arm. The paravesical space was opened with blunt and sharp dissection to mobilize the ureter off of the medial surface of the internal iliac artery. The medial leaf of the broad ligament containing the ureter was held medially (opening the pararectal space) by the assistant's grasper. The left pelvic lymphadenectomy was performed by skeletonizing the internal iliac artery at the bifurcation with the external iliac artery. The obturator nerve was identified in the base of lateral paravesical space. The ureter was mobilized medially off of the dissection by developing the pararectal space. The genitofemoral nerve was identified, skeletonized and mobilized laterally off of the external iliac artery. An enbloc resection of lymph nodes was performed within the following boundaries: the mid portion of the common iliac proximally, the circumflex iliac vein distally, the obturator nerve posteriorally, the genitofemoral nerve laterally. The nodal basin (including obturator space)  were confirmed to be empty of nodes and hemostatic. The nodes were placed in an endocatch bag and retrieved vaginally.  The colpotomy at the vaginal cuff was closed with Vicryl on a CT1 needle in a running manner.  Irrigation was used and excellent hemostasis was achieved.  At this point in the procedure was completed.  Robotic instruments were removed under direct visulaization.  The robot was undocked. The 10 mm ports were closed with Vicryl on  a UR-5 needle and the fascia was closed with 0 Vicryl on a UR-5 needle.  The skin was closed with 4-0 Vicryl in a subcuticular manner.  Dermabond was applied.  Sponge, lap and needle counts correct x 2.  The patient was taken to the recovery room in stable condition.  The vagina was swabbed with  minimal bleeding noted.   All instrument and needle counts were correct x  3.   The patient was transferred to the recovery room in a stable condition.  Donaciano Eva, MD

## 2016-01-19 NOTE — Interval H&P Note (Signed)
History and Physical Interval Note:  01/19/2016 7:16 AM  Amy Holmes  has presented today for surgery, with the diagnosis of uterine cancer  The various methods of treatment have been discussed with the patient and family. After consideration of risks, benefits and other options for treatment, the patient has consented to  Procedure(s): XI ROBOTIC ASSISTED TOTAL HYSTERECTOMY WITH BILATERAL SALPINGO OOPHORECTOMY (N/A) sentienal LYMPH NODE mapping (N/A) as a surgical intervention .  The patient's history has been reviewed, patient examined, no change in status, stable for surgery.  I have reviewed the patient's chart and labs.  Questions were answered to the patient's satisfaction.     Donaciano Eva

## 2016-01-19 NOTE — Transfer of Care (Signed)
Immediate Anesthesia Transfer of Care Note  Patient: SATORIA TOKUDA  Procedure(s) Performed: Procedure(s): XI ROBOTIC ASSISTED TOTAL HYSTERECTOMY WITH BILATERAL SALPINGO OOPHORECTOMY (N/A) sentienal LYMPH NODE mapping, bilateral pelvic lymph node dissection (N/A)  Patient Location: PACU  Anesthesia Type:General  Level of Consciousness: awake and alert   Airway & Oxygen Therapy: Patient Spontanous Breathing and Patient connected to face mask oxygen  Post-op Assessment: Report given to RN and Post -op Vital signs reviewed and stable  Post vital signs: Reviewed and stable  Last Vitals:  Filed Vitals:   01/19/16 0720 01/19/16 0723  BP: 145/74 155/75  Pulse:    Temp:    Resp:      Last Pain: There were no vitals filed for this visit.    Patients Stated Pain Goal: 4 ([id number])  Complications: No apparent anesthesia complications

## 2016-01-19 NOTE — H&P (View-Only) (Signed)
Consult Note: Gyn-Onc  Consult was requested by Dr. Julien Girt for the evaluation of Amy Holmes 60 y.o. female  CC:  Chief Complaint  Patient presents with  . endometrial cancer    Assessment/Plan:  [name]  is a 60 y.o.  year old with grade 1 endometrioid endometrial cancer.   A detailed discussion was held with the patient and her family with regard to to her endometrial cancer diagnosis. We discussed the standard management options for uterine cancer which includes surgery followed possibly by adjuvant therapy depending on the results of surgery. The options for surgical management include a hysterectomy and removal of the tubes and ovaries possibly with removal of pelvic and para-aortic lymph nodes. A minimally invasive approach including a robotic hysterectomy or laparoscopic hysterectomy have benefits including shorter hospital stay, recovery time and better wound healing. The alternative approach is an open hysterectomy. The patient has been counseled about these surgical options and the risks of surgery in general including infection, bleeding, damage to surrounding structures (including bowel, bladder, ureters, nerves or vessels), and the postoperative risks of PE/ DVT, and lymphedema. I extensively reviewed the additional risks of robotic hysterectomy including possible need for conversion to open laparotomy.  I discussed positioning during surgery of trendelenberg and risks of minor facial swelling and care we take in preoperative positioning.  After counseling and consideration of her options, she desires to proceed with robotic assisted total hysterectomy, BSO, sentinel lymph node biopsy.   She will be seen by anesthesia for preoperative clearance and discussion of postoperative pain management.  She was given the opportunity to ask questions, which were answered to her satisfaction, and she is agreement with the above mentioned plan of care.  She is desiring a surgical  date as soon as possible, therefore I have scheduled her to have surgery with my partner on 01/17/16 in order to expedite things.   HPI: Amy Holmes is a 60 year old G3P3 who is seen in consultation at the request of Dr Julien Girt for grade 1 endometrial cancer. The patient had her first episode of postmenopausal bleeding in January 2016. This was worked up with a pap smear that was normal. She had another episode of spotting in 2016, and then again in March, 2017. At that time she presented to Dr Julien Girt office who performed an [place] which showed a normal size uterus but thickened endometrial stripe. A 1.3cm dermoid was identified on the left ovary.   A hysteroscopy and D&C was performed on 12/27/15 which showed grade 1 endometrial cancer.   She is otherwise very healthy. Her only prior abdominal surgeries are a laparoscopic cholecystectomy and tubal ligation. She has had 3 prior vaginal deliveries. Her BMI is 31.   Current Meds:  Outpatient Encounter Prescriptions as of 01/02/2016  Medication Sig  . [DISCONTINUED] albuterol (PROVENTIL HFA;VENTOLIN HFA) 108 (90 BASE) MCG/ACT inhaler Inhale 2 puffs into the lungs every 6 (six) hours as needed for wheezing or shortness of breath.   No facility-administered encounter medications on file as of 01/02/2016.    Allergy: No Known Allergies  Social Hx:   Social History   Social History  . Marital Status: Divorced    Spouse Name: N/A  . Number of Children: N/A  . Years of Education: N/A   Occupational History  . HR     Social History Main Topics  . Smoking status: Former Smoker -- 0.25 packs/day for 20 years    Types: Cigarettes    Quit  date: 08/10/2014  . Smokeless tobacco: Not on file  . Alcohol Use: 0.0 oz/week    0 Standard drinks or equivalent per week     Comment: 1 drink here and there  . Drug Use: Not on file  . Sexual Activity: Not on file   Other Topics Concern  . Not on file   Social History Narrative   Work or School:      Home  Situation:      Spiritual Beliefs:      Lifestyle:             Past Surgical Hx:  Past Surgical History  Procedure Laterality Date  . Lumbar disc surgery    . Tubal ligation      Past Medical Hx:  Past Medical History  Diagnosis Date  . Obesity   . Tobacco abuse   . Palpitations   . Anxiety and depression     Past Gynecological History:  SVD x 3  No LMP recorded. Patient is postmenopausal.  Family Hx:  Family History  Problem Relation Age of Onset  . Heart disease Mother   . Diabetes Mother   . Depression Maternal Grandmother   . Depression Maternal Grandfather   . Asthma Son   . Asthma Daughter     Review of Systems:  Constitutional  Feels well,    ENT Normal appearing ears and nares bilaterally Skin/Breast  No rash, sores, jaundice, itching, dryness Cardiovascular  No chest pain, shortness of breath, or edema  Pulmonary  No cough or wheeze.  Gastro Intestinal  No nausea, vomitting, or diarrhoea. No bright red blood per rectum, no abdominal pain, change in bowel movement, or constipation.  Genito Urinary  No frequency, urgency, dysuria, + postmenopausal bleeding Musculo Skeletal  No myalgia, arthralgia, joint swelling or pain  Neurologic  No weakness, numbness, change in gait,  Psychology  No depression, anxiety, insomnia.   Vitals:  Blood pressure 180/73, pulse 77, temperature 98 F (36.7 C), temperature source Oral, resp. rate 18, height 5' 6.5" (1.689 m), weight 204 lb 6.4 oz (92.715 kg), SpO2 100 %.  Physical Exam: WD in NAD Neck  Supple NROM, without any enlargements.  Lymph Node Survey No cervical supraclavicular or inguinal adenopathy Cardiovascular  Pulse normal rate, regularity and rhythm. S1 and S2 normal.  Lungs  Clear to auscultation bilateraly, without wheezes/crackles/rhonchi. Good air movement.  Skin  No rash/lesions/breakdown  Psychiatry  Alert and oriented to person, place, and time  Abdomen  Normoactive bowel sounds,  abdomen soft, non-tender and overweight without evidence of hernia.  Back No CVA tenderness Genito Urinary  Vulva/vagina: Normal external female genitalia.  No lesions. No discharge or bleeding.  Bladder/urethra:  No lesions or masses, well supported bladder  Vagina: normal  Cervix: Normal appearing, no lesions.  Uterus:  Small, mobile, no parametrial involvement or nodularity.  Adnexa: no palpable masses. Rectal  Good tone, no masses no cul de sac nodularity.  Extremities  No bilateral cyanosis, clubbing or edema.   Donaciano Eva, MD  01/02/2016, 5:12 PM

## 2016-01-19 NOTE — Anesthesia Procedure Notes (Signed)
Procedure Name: Intubation Date/Time: 01/19/2016 9:21 AM Performed by: Cynda Familia Pre-anesthesia Checklist: Patient identified, Emergency Drugs available, Suction available and Patient being monitored Patient Re-evaluated:Patient Re-evaluated prior to inductionOxygen Delivery Method: Circle System Utilized Preoxygenation: Pre-oxygenation with 100% oxygen Intubation Type: IV induction Ventilation: Mask ventilation without difficulty Laryngoscope Size: Miller and 2 Grade View: Grade I Tube type: Oral Tube size: 7.0 mm Number of attempts: 1 Airway Equipment and Method: Stylet and Oral airway Placement Confirmation: ETT inserted through vocal cords under direct vision,  positive ETCO2 and breath sounds checked- equal and bilateral Secured at: 22 cm Tube secured with: Tape Dental Injury: Teeth and Oropharynx as per pre-operative assessment  Comments: Smooth IV induction--  Intubation AM CRNA--- atraumatic--- teeth and mouth as preop- front teeth with irregular surfaces- broken teeth upper back- Glennon Mac present

## 2016-01-19 NOTE — Anesthesia Postprocedure Evaluation (Signed)
Anesthesia Post Note  Patient: Amy Holmes  Procedure(s) Performed: Procedure(s) (LRB): XI ROBOTIC ASSISTED TOTAL HYSTERECTOMY WITH BILATERAL SALPINGO OOPHORECTOMY (N/A) sentienal LYMPH NODE mapping, bilateral pelvic lymph node dissection (N/A)  Patient location during evaluation: PACU Anesthesia Type: General Level of consciousness: sedated Pain management: satisfactory to patient Vital Signs Assessment: post-procedure vital signs reviewed and stable Respiratory status: spontaneous breathing Cardiovascular status: stable Anesthetic complications: no    Last Vitals:  Filed Vitals:   01/19/16 1244 01/19/16 1349  BP: 157/76 181/92  Pulse: 87 82  Temp: 37.2 C 36.8 C  Resp: 16 16    Last Pain:  Filed Vitals:   01/19/16 1349  PainSc: 3                  Aeon Koors EDWARD

## 2016-01-20 DIAGNOSIS — C541 Malignant neoplasm of endometrium: Secondary | ICD-10-CM | POA: Diagnosis not present

## 2016-01-20 LAB — BASIC METABOLIC PANEL
ANION GAP: 9 (ref 5–15)
BUN: 13 mg/dL (ref 6–20)
CHLORIDE: 106 mmol/L (ref 101–111)
CO2: 25 mmol/L (ref 22–32)
Calcium: 8.6 mg/dL — ABNORMAL LOW (ref 8.9–10.3)
Creatinine, Ser: 0.78 mg/dL (ref 0.44–1.00)
Glucose, Bld: 128 mg/dL — ABNORMAL HIGH (ref 65–99)
POTASSIUM: 3.7 mmol/L (ref 3.5–5.1)
Sodium: 140 mmol/L (ref 135–145)

## 2016-01-20 LAB — CBC
HEMATOCRIT: 37.2 % (ref 36.0–46.0)
HEMOGLOBIN: 12.1 g/dL (ref 12.0–15.0)
MCH: 28.1 pg (ref 26.0–34.0)
MCHC: 32.5 g/dL (ref 30.0–36.0)
MCV: 86.5 fL (ref 78.0–100.0)
Platelets: 158 10*3/uL (ref 150–400)
RBC: 4.3 MIL/uL (ref 3.87–5.11)
RDW: 14.1 % (ref 11.5–15.5)
WBC: 6.3 10*3/uL (ref 4.0–10.5)

## 2016-01-20 MED ORDER — ZOLPIDEM TARTRATE 5 MG PO TABS: 5.0000 mg | ORAL_TABLET | Freq: Every evening | ORAL | Status: DC | PRN

## 2016-01-20 MED ORDER — TRAMADOL HCL 50 MG PO TABS: 50.0000 mg | ORAL_TABLET | Freq: Four times a day (QID) | ORAL | Status: DC | PRN

## 2016-01-20 NOTE — Discharge Instructions (Signed)
01/20/2016  Return to work: 4-6 weeks if applicable  Activity: 1. Be up and out of the bed during the day.  Take a nap if needed.  You may walk up steps but be careful and use the hand rail.  Stair climbing will tire you more than you think, you may need to stop part way and rest.   2. No lifting or straining for 6 weeks.  3. No driving for 1 week(s).  Do not drive if you are taking narcotic pain medicine.  4. Shower daily.  Use soap and water on your incision and pat dry; don't rub.  No tub baths until cleared by your surgeon.   5. No sexual activity and nothing in the vagina for 6-8 weeks.  6. You may experience a small amount of clear drainage from your incisions, which is normal.  If the drainage persists or increases, please call the office.  7. You may experience light vaginal spotting which is normal.  If you notice moderate vaginal bleeding like a menstrual cycle, please call the office.  Diet: 1. Low sodium Heart Healthy Diet is recommended.  2. It is safe to use a laxative, such as Miralax or Colace, if you have difficulty moving your bowels.   Wound Care: 1. Keep clean and dry.  Shower daily.  Reasons to call the Doctor:  Fever - Oral temperature greater than 100.4 degrees Fahrenheit  Foul-smelling vaginal discharge  Difficulty urinating  Nausea and vomiting  Increased pain at the site of the incision that is unrelieved with pain medicine.  Difficulty breathing with or without chest pain  New calf pain especially if only on one side  Sudden, continuing increased vaginal bleeding with or without clots.   Contacts: For questions or concerns you should contact:  Dr. Everitt Amber at [phone]  Joylene John, NP at [phone]  After Hours: call [phone] and have the GYN Oncologist paged/contacted  Abdominal Hysterectomy, Care After These instructions give you information on caring for yourself after your procedure. Your doctor may also give you more  specific instructions. Call your doctor if you have any problems or questions after your procedure.  HOME CARE It takes 4-6 weeks to recover from this surgery. Follow all of your doctor's instructions.   Only take medicines as told by your doctor.  Take showers for 2-3 weeks. Ask your doctor when it is okay to shower.  Do not douche, use tampons, or have sex (intercourse) for at least 6 weeks or as told.  Follow your doctor's advice about exercise, lifting objects, driving, and general activities.  Get plenty of rest and sleep.  Do not lift anything heavier than a gallon of milk (about 10 pounds [4.5 kilograms]) for the first month after surgery.  Get back to your normal diet as told by your doctor.  Do not drink alcohol until your doctor says it is okay.  Take a medicine to help you poop (laxative) as told by your doctor.  Eating foods high in fiber may help you poop. Eat a lot of raw fruits and vegetables, whole grains, and beans.  Drink enough fluids to keep your pee (urine) clear or pale yellow.  Have someone help you at home for 1-2 weeks after your surgery.  Keep follow-up doctor visits as told. GET HELP IF:  You have chills or fever.  You have puffiness, redness, or pain in area of the cut (incision).  You have yellowish-white fluid (pus) coming from the cut.  You have  a bad smell coming from the cut or bandage.  Your cut pulls apart.  You feel dizzy or light-headed.  You have pain or bleeding when you pee.  You keep having watery poop (diarrhea).  You keep feeling sick to your stomach (nauseous) or keep throwing up (vomiting).  You have fluid (discharge) coming from your vagina.  You have a rash.  You have a reaction to your medicine.  You need stronger pain medicine. GET HELP RIGHT AWAY IF:   You have a fever and your symptoms suddenly get worse.  You have bad belly (abdominal) pain.  You have chest pain.  You are short of breath.  You pass  out (faint).  You have pain, puffiness, or redness of your leg.  You bleed a lot from your vagina and notice clumps of tissue (clots). MAKE SURE YOU:   Understand these instructions.  Will watch your condition.  Will get help right away if you are not doing well or get worse.   This information is not intended to replace advice given to you by your health care provider. Make sure you discuss any questions you have with your health care provider.   Document Released: 06/05/2008 Document Revised: 09/01/2013 Document Reviewed: 06/19/2013 Elsevier Interactive Patient Education 2016 Elsevier Inc.  Tramadol tablets What is this medicine? TRAMADOL (TRA ma dole) is a pain reliever. It is used to treat moderate to severe pain in adults. This medicine may be used for other purposes; ask your health care provider or pharmacist if you have questions. What should I tell my health care provider before I take this medicine? They need to know if you have any of these conditions: -brain tumor -depression -drug abuse or addiction -head injury -if you frequently drink alcohol containing drinks -kidney disease or trouble passing urine -liver disease -lung disease, asthma, or breathing problems -seizures or epilepsy -suicidal thoughts, plans, or attempt; a previous suicide attempt by you or a family member -an unusual or allergic reaction to tramadol, codeine, other medicines, foods, dyes, or preservatives -pregnant or trying to get pregnant -breast-feeding How should I use this medicine? Take this medicine by mouth with a full glass of water. Follow the directions on the prescription label. If the medicine upsets your stomach, take it with food or milk. Do not take more medicine than you are told to take. Talk to your pediatrician regarding the use of this medicine in children. Special care may be needed. Overdosage: If you think you have taken too much of this medicine contact a poison control  center or emergency room at once. NOTE: This medicine is only for you. Do not share this medicine with others. What if I miss a dose? If you miss a dose, take it as soon as you can. If it is almost time for your next dose, take only that dose. Do not take double or extra doses. What may interact with this medicine? Do not take this medicine with any of the following medications: -MAOIs like Carbex, Eldepryl, Marplan, Nardil, and Parnate This medicine may also interact with the following medications: -alcohol or medicines that contain alcohol -antihistamines -benzodiazepines -bupropion -carbamazepine or oxcarbazepine -clozapine -cyclobenzaprine -digoxin -furazolidone -linezolid -medicines for depression, anxiety, or psychotic disturbances -medicines for migraine headache like almotriptan, eletriptan, frovatriptan, naratriptan, rizatriptan, sumatriptan, zolmitriptan -medicines for pain like pentazocine, buprenorphine, butorphanol, meperidine, nalbuphine, and propoxyphene -medicines for sleep -muscle relaxants -naltrexone -phenobarbital -phenothiazines like perphenazine, thioridazine, chlorpromazine, mesoridazine, fluphenazine, prochlorperazine, promazine, and trifluoperazine -procarbazine -warfarin This list  may not describe all possible interactions. Give your health care provider a list of all the medicines, herbs, non-prescription drugs, or dietary supplements you use. Also tell them if you smoke, drink alcohol, or use illegal drugs. Some items may interact with your medicine. What should I watch for while using this medicine? Tell your doctor or health care professional if your pain does not go away, if it gets worse, or if you have new or a different type of pain. You may develop tolerance to the medicine. Tolerance means that you will need a higher dose of the medicine for pain relief. Tolerance is normal and is expected if you take this medicine for a long time. Do not suddenly  stop taking your medicine because you may develop a severe reaction. Your body becomes used to the medicine. This does NOT mean you are addicted. Addiction is a behavior related to getting and using a drug for a non-medical reason. If you have pain, you have a medical reason to take pain medicine. Your doctor will tell you how much medicine to take. If your doctor wants you to stop the medicine, the dose will be slowly lowered over time to avoid any side effects. You may get drowsy or dizzy. Do not drive, use machinery, or do anything that needs mental alertness until you know how this medicine affects you. Do not stand or sit up quickly, especially if you are an older patient. This reduces the risk of dizzy or fainting spells. Alcohol can increase or decrease the effects of this medicine. Avoid alcoholic drinks. You may have constipation. Try to have a bowel movement at least every 2 to 3 days. If you do not have a bowel movement for 3 days, call your doctor or health care professional. Your mouth may get dry. Chewing sugarless gum or sucking hard candy, and drinking plenty of water may help. Contact your doctor if the problem does not go away or is severe. What side effects may I notice from receiving this medicine? Side effects that you should report to your doctor or health care professional as soon as possible: -allergic reactions like skin rash, itching or hives, swelling of the face, lips, or tongue -breathing difficulties, wheezing -confusion -itching -light headedness or fainting spells -redness, blistering, peeling or loosening of the skin, including inside the mouth -seizures Side effects that usually do not require medical attention (report to your doctor or health care professional if they continue or are bothersome): -constipation -dizziness -drowsiness -headache -nausea, vomiting This list may not describe all possible side effects. Call your doctor for medical advice about side  effects. You may report side effects to FDA at 1-800-FDA-1088. Where should I keep my medicine? Keep out of the reach of children. This medicine may cause accidental overdose and death if it taken by other adults, children, or pets. Mix any unused medicine with a substance like cat litter or coffee grounds. Then throw the medicine away in a sealed container like a sealed bag or a coffee can with a lid. Do not use the medicine after the expiration date. Store at room temperature between 15 and 30 degrees C (59 and 86 degrees F). NOTE: This sheet is a summary. It may not cover all possible information. If you have questions about this medicine, talk to your doctor, pharmacist, or health care provider.    2016, Elsevier/Gold Standard. (2013-10-23 15:42:09)

## 2016-01-20 NOTE — Progress Notes (Signed)
Patient discharged home at 1130am, in stable condition, assessment unchanged. Family provided transportation. Discharged instructions given to patient, understanding verbalized by patient and family.

## 2016-01-20 NOTE — Discharge Summary (Signed)
Physician Discharge Summary  Patient ID: Amy Holmes MRN: [id number] DOB/AGE: June 26, 1956 60 y.o.  Admit date: 01/19/2016 Discharge date: 01/20/2016  Admission Diagnoses: Endometrial cancer [facility])  Discharge Diagnoses:  Principal Problem:   Endometrial cancer [facility]   Discharged Condition:  The patient is in good condition and stable for discharge.    Hospital Course: On 01/19/2016, the patient underwent the following: Procedure(s): XI ROBOTIC ASSISTED TOTAL HYSTERECTOMY WITH BILATERAL SALPINGO OOPHORECTOMY sentinel LYMPH NODE mapping, bilateral pelvic lymph node dissection.  The postoperative course was uneventful.  She was discharged to home on postoperative day 1 tolerating a regular diet, passing flatus and having bowel movements, minimal pain, voiding without difficulty.  Consults: None  Significant Diagnostic Studies: None  Treatments: surgery: see above  Discharge Exam: Blood pressure 126/70, pulse 77, temperature 98.9 F (37.2 C), temperature source Oral, resp. rate 16, height 5\' 6"  (1.676 m), weight 206 lb (93.441 kg), SpO2 98 %. General appearance: alert, cooperative, appears stated age and no distress Resp: clear to auscultation bilaterally Cardio: regular rate and rhythm, S1, S2 normal, no murmur, click, rub or gallop GI: soft, non-tender; bowel sounds normal; no masses,  no organomegaly Extremities: extremities normal, atraumatic, no cyanosis or edema and reporting mild numbness in the left upper inferior thigh, no edema, swelling, erythema Incision/Wound: Lap sites with dermabond without erythema or drainage  Disposition: Home      Discharge Instructions    Call MD for:  difficulty breathing, headache or visual disturbances    Complete by:  As directed      Call MD for:  extreme fatigue    Complete by:  As directed      Call MD for:  hives    Complete by:  As directed      Call MD for:  persistant dizziness or light-headedness    Complete by:  As directed       Call MD for:  persistant nausea and vomiting    Complete by:  As directed      Call MD for:  redness, tenderness, or signs of infection (pain, swelling, redness, odor or green/yellow discharge around incision site)    Complete by:  As directed      Call MD for:  severe uncontrolled pain    Complete by:  As directed      Call MD for:  temperature >100.4    Complete by:  As directed      Diet - low sodium heart healthy    Complete by:  As directed      Driving Restrictions    Complete by:  As directed   No driving for 1 week.  Do not take narcotics and drive.     Increase activity slowly    Complete by:  As directed      Lifting restrictions    Complete by:  As directed   No lifting greater than 10 lbs.     Sexual Activity Restrictions    Complete by:  As directed   No sexual activity, nothing in the vagina, for 6-8 weeks.            Medication List    TAKE these medications        aspirin EC 81 MG tablet  Take 81 mg by mouth daily.     CALCIUM CARBONATE PO  Take 1 tablet by mouth daily.     Coenzyme Q10 100 MG Tabs  Take 100 mg by mouth daily.  fexofenadine 180 MG tablet  Commonly known as:  ALLEGRA  Take 180 mg by mouth daily as needed for allergies or rhinitis.     loratadine 10 MG tablet  Commonly known as:  CLARITIN  Take 10 mg by mouth daily as needed for allergies.     traMADol 50 MG tablet  Commonly known as:  ULTRAM  Take 1 tablet (50 mg total) by mouth every 6 (six) hours as needed for severe pain.     TYLENOL PM EXTRA STRENGTH 25-500 MG Tabs tablet  Generic drug:  diphenhydramine-acetaminophen  Take 1 tablet by mouth at bedtime as needed.     vitamin C 500 MG tablet  Commonly known as:  ASCORBIC ACID  Take 500 mg by mouth daily.     zolpidem 5 MG tablet  Commonly known as:  AMBIEN  Take 1 tablet (5 mg total) by mouth at bedtime as needed for sleep. Do not take with pain meds       Follow-up Information    Follow up with Donaciano Eva, MD On 02/13/2016.   Specialty:  Obstetrics and Gynecology   Why:  at 2:30pm at the [facility] for post-op follow up   Contact information:   [address] [place] [address] [phone]       Greater than thirty minutes were spend for face to face discharge instructions and discharge orders/summary in EPIC.   Signed: Gayleen Sholtz DEAL 01/20/2016, 10:46 AM

## 2016-01-30 ENCOUNTER — Other Ambulatory Visit: Payer: Self-pay | Admitting: Gynecologic Oncology

## 2016-01-30 ENCOUNTER — Telehealth: Payer: Self-pay | Admitting: Gynecologic Oncology

## 2016-01-30 DIAGNOSIS — C541 Malignant neoplasm of endometrium: Secondary | ICD-10-CM

## 2016-01-30 NOTE — Progress Notes (Signed)
GYN Location of Tumor / Histology: stage IIIC1 high grade endometrioid adenocarcinoma, with positive bilateral pelvic nodes.   Amy Holmes presented with symptoms of: postmenopausal bleeding in January 2016. This was worked up with a pap smear that was normal. She had another episode of spotting in 2016, and then again in March, 2017.    Biopsies revealed:   01/19/16 Diagnosis 1. Lymph nodes, regional resection, right pelvic - ONE LYMPH NODE SHOWING ISOLATED TUMOR CELLS. - EIGHT ADDITIONAL BENIGN LYMPH NODES WITH NO TUMOR SEEN (0/8). 2. Lymph nodes, regional resection, left pelvic - ONE LYMPH NODE POSITIVE FOR METASTATIC CARCINOMA (1/1). - ONE LYMPH NODE SHOWING ISOLATED TUMOR CELLS. - FOUR ADDITIONAL BENIGN LYMPH NODES WITH NO TUMOR SEEN (0/4). 3. Uterus +/- tubes/ovaries, neoplastic, cervix - POORLY DIFFERENTIATED ADENOCARCINOMA (FIGO GRADE 3) ARISING FROM A WELL DIFFERENTIATED ENDOMETRIOID ADENOCARCINOMA. - TUMOR MEASURES 7.2 CM IN GREATEST DIMENSION. - TUMOR INVADES INTO OUTER HALF OF MYOMETRIUM BUT DOES NOT INVOLVE THE CERVIX. - SEE ONCOLOGY TEMPLATE. ADDITIONAL FINDINGS: - MYOMETRIUM ALSO DEMONSTRATES LEIOMYOMA FORMATION. - BENIGN CERVIX. - BENIGN BILATERAL OVARIES. - BENIGN BILATERAL FALLOPIAN TUBES.  Past/Anticipated interventions by Gyn/Onc surgery, if any: 01/19/16 - XI ROBOTIC ASSISTED TOTAL HYSTERECTOMY WITH BILATERAL SALPINGO OOPHORECTOMY;  Surgeon: Everitt Amber, MD and sentienal LYMPH NODE mapping, bilateral pelvic lymph node dissection;  Surgeon: Everitt Amber, MD  Past/Anticipated interventions by medical oncology, if any: consult with Dr. Marko Plume on 02/07/16   Weight changes, if any: no  Bowel/Bladder complaints, if any: No.  Nausea/Vomiting, if any: no  Pain issues, if any:  no  SAFETY ISSUES:  Prior radiation? no  Pacemaker/ICD? no  Possible current pregnancy? no  Is the patient on methotrexate? no  Current Complaints / other details:  Dr. Denman George is  recommending "external beam pelvic and PA radiation with cisplatin 50mg /m2 on days 1 and 28. Vaginal brachytherapy 4 cycles of carboplatin and paclitaxel (the brachytherapy should be able to be intercollated within the chemotherapy to prevent delays in receiving the cytotoxic therapy)."  BP 169/77 mmHg  Pulse 84  Temp(Src) 98.3 F (36.8 C) (Oral)  Ht 5\' 6"  (1.676 m)  Wt 211 lb 8 oz (95.936 kg)  BMI 34.15 kg/m2  SpO2 97%   Wt Readings from Last 3 Encounters:  02/02/16 211 lb 8 oz (95.936 kg)  01/19/16 206 lb (93.441 kg)  01/12/16 206 lb 9.6 oz (93.713 kg)

## 2016-01-30 NOTE — Telephone Encounter (Signed)
Asked patient to call back. Has stage IIIC1 endometrial cancer. Needs imaging and referral to med onc and rad onc.

## 2016-01-30 NOTE — Telephone Encounter (Signed)
Informed patient of findings of stage IIIC1 high grade endometrioid adenocarcinoma, with positive bilateral pelvic nodes.   I am recommending CT chest, abdo and pelvis to evaluate for distant measurable disease.  I am recommending adjuvant therapy with chemo/RT:  External beam pelvic and PA radiation with cisplatin 50mg /m2 on days 1 and 28. Vaginal brachytherapy  4 cycles of carboplatin and paclitaxel (the brachytherapy should be able to be intercollated within the chemotherapy to prevent delays in receiving the cytotoxic therapy).  Donaciano Eva, MD

## 2016-01-31 ENCOUNTER — Encounter: Payer: Self-pay | Admitting: *Deleted

## 2016-02-01 ENCOUNTER — Encounter: Payer: Self-pay | Admitting: *Deleted

## 2016-02-02 ENCOUNTER — Encounter: Payer: Self-pay | Admitting: Radiation Oncology

## 2016-02-02 ENCOUNTER — Ambulatory Visit
Admission: RE | Admit: 2016-02-02 | Discharge: 2016-02-02 | Disposition: A | Discharge status: Home / Self Care | Payer: 59 | Source: Ambulatory Visit | Attending: Radiation Oncology | Admitting: Radiation Oncology

## 2016-02-02 ENCOUNTER — Ambulatory Visit (HOSPITAL_COMMUNITY)
Admission: RE | Admit: 2016-02-02 | Discharge: 2016-02-02 | Disposition: A | Discharge status: Home / Self Care | Payer: 59 | Source: Ambulatory Visit | Attending: Gynecologic Oncology | Admitting: Gynecologic Oncology

## 2016-02-02 ENCOUNTER — Encounter: Payer: Self-pay | Admitting: *Deleted

## 2016-02-02 ENCOUNTER — Other Ambulatory Visit: Payer: 59

## 2016-02-02 ENCOUNTER — Encounter (HOSPITAL_COMMUNITY): Payer: Self-pay

## 2016-02-02 ENCOUNTER — Ambulatory Visit: Payer: 59 | Admitting: Radiation Oncology

## 2016-02-02 VITALS — BP 169/77 | HR 84 | Temp 98.3°F | Ht 66.0 in | Wt 211.5 lb

## 2016-02-02 DIAGNOSIS — C541 Malignant neoplasm of endometrium: Secondary | ICD-10-CM | POA: Insufficient documentation

## 2016-02-02 DIAGNOSIS — Z510 Encounter for antineoplastic radiation therapy: Secondary | ICD-10-CM | POA: Insufficient documentation

## 2016-02-02 MED ORDER — DIATRIZOATE MEGLUMINE & SODIUM 66-10 % PO SOLN
30.0000 mL | Freq: Once | ORAL | Status: AC
  Administered 2016-02-02: 30 mL via ORAL

## 2016-02-02 MED ORDER — IOPAMIDOL (ISOVUE-300) INJECTION 61%
100.0000 mL | Freq: Once | INTRAVENOUS | Status: AC | PRN
  Administered 2016-02-02: 100 mL via INTRAVENOUS

## 2016-02-02 NOTE — Progress Notes (Signed)
Radiation Oncology         [phone]) [phone] ________________________________  Initial Outpatient Consultation  Name: Amy Holmes MRN: [id number]  Date: 02/02/2016  DOB: 1956/08/14  CC:Amy, Nickola Holmes., DO  Amy Amber, MD   REFERRING PHYSICIAN: Everitt Amber, MD  DIAGNOSIS: Stage IIIC1 high grade endometrioid adenocarcinoma  HISTORY OF PRESENT ILLNESS::Amy Holmes is a 60 y.o. female. The patient had her first episode of postmenopausal bleeding in January 2016. This was worked up with a pap smear that was normal. She had another episode of spotting in 2016, and then again in March 2017. At that time she presented to Dr. Julien Girt office who performed an [place] which showed a normal size uterus but thickened endometrial stripe. A 1.3 cm dermoid was identified on the left ovary.   A hysteroscopy and D&C was performed on 12/27/15 which showed grade 1 endometrial cancer. The patient proceeded with robotic total hysterectomy, BSO and sentinel lymph node dissection on 01/19/16 with Dr. Denman George. Pathology indicated a grade 3 endometrioid adenocarcinoma measuring 7.2 cm with myometrium invasion and positive bilateral pelvic nodes. (Isolated tumor cells within one of  the lymph nodes seen  in the right pelvis)  At this time, Dr. Denman George is recommending: External beam pelvic and PA radiation with cisplatin 50mg /m2 on days 1 and 28. After, vaginal brachytherapy concurrently within 4 cycles of carboplatin and paclitaxel.   On today's visit, the patient states she is doing well since surgery. She does report post-nasal drip but likely allergy related. Occasional low energy but tolerable. Denies pain in the pelvis, no bowel or urinary symptoms, no bleeding, no headaches or visual changes.   PREVIOUS RADIATION THERAPY: No  PAST MEDICAL HISTORY:  has a past medical history of Obesity; Tobacco abuse; Palpitations; Anxiety and depression; Insomnia; and [id number] (12/2015).    PAST SURGICAL HISTORY: Past Surgical History    Procedure Laterality Date  . Lumbar disc surgery      x 2  . Tubal ligation    . Cholecystectomy  2007  . Robotic assisted total hysterectomy with bilateral salpingo oopherectomy N/A 01/19/2016    Procedure: XI ROBOTIC ASSISTED TOTAL HYSTERECTOMY WITH BILATERAL SALPINGO OOPHORECTOMY;  Surgeon: Amy Amber, MD;  Location: [facility];  Service: Gynecology;  Laterality: N/A;  . Lymph node dissection N/A 01/19/2016    Procedure: sentienal LYMPH NODE mapping, bilateral pelvic lymph node dissection;  Surgeon: Amy Amber, MD;  Location: [facility];  Service: Gynecology;  Laterality: N/A;    FAMILY HISTORY: family history includes Asthma in her daughter and son; Cancer in her maternal grandfather; Depression in her maternal grandfather and maternal grandmother; Diabetes in her mother; Heart disease in her mother.  SOCIAL HISTORY:  reports that she has been smoking Cigarettes.  She has a 5 pack-year smoking history. She has never used smokeless tobacco. She reports that she drinks alcohol. She reports that she does not use illicit drugs.  ALLERGIES: Review of patient's allergies indicates no known allergies.  MEDICATIONS:  Current Outpatient Prescriptions  Medication Sig Dispense Refill  . aspirin EC 81 MG tablet Take 81 mg by mouth daily.    Marland Kitchen CALCIUM CARBONATE PO Take 1 tablet by mouth daily.    . Coenzyme Q10 100 MG TABS Take 100 mg by mouth daily.    . fexofenadine (ALLEGRA) 180 MG tablet Take 180 mg by mouth daily as needed for allergies or rhinitis.    Marland Kitchen loratadine (CLARITIN) 10 MG tablet Take 10 mg by mouth daily as needed for  allergies.    . vitamin C (ASCORBIC ACID) 500 MG tablet Take 500 mg by mouth daily.    Marland Kitchen zolpidem (AMBIEN) 5 MG tablet Take 1 tablet (5 mg total) by mouth at bedtime as needed for sleep. Do not take with pain meds 10 tablet 0  . diphenhydramine-acetaminophen (TYLENOL PM EXTRA STRENGTH) 25-500 MG TABS tablet Take 1 tablet by mouth at bedtime as needed. Reported on 02/02/2016     . traMADol (ULTRAM) 50 MG tablet Take 1 tablet (50 mg total) by mouth every 6 (six) hours as needed for severe pain. (Patient not taking: Reported on 02/02/2016) 30 tablet 0   No current facility-administered medications for this encounter.    REVIEW OF SYSTEMS:  A 15 point review of systems is documented in the electronic medical record. This was obtained by the nursing staff. However, I reviewed this with the patient to discuss relevant findings and make appropriate changes.  Pertinent items are noted in HPI.   PHYSICAL EXAM:  height is 5\' 6"  (1.676 m) and weight is 211 lb 8 oz (95.936 kg). Her oral temperature is 98.3 F (36.8 C). Her blood pressure is 169/77 and her pulse is 84. Her oxygen saturation is 97%.   General: Alert and oriented, in no acute distress HEENT: Head is normocephalic. Extraocular movements are intact. Oropharynx is clear. Neck: Neck is supple, no palpable cervical or supraclavicular lymphadenopathy. Heart: Regular in rate and rhythm with no murmurs, rubs, or gallops. Chest: Clear to auscultation bilaterally, with no rhonchi, wheezes, or rales. Abdomen: Soft, nontender, nondistended, with no rigidity or guarding. 5 laproscopic scars are healing well without signs of infection or drainage.  Extremities: No cyanosis or edema. Lymphatics: see Neck Exam Skin: No concerning lesions. Musculoskeletal: symmetric strength and muscle tone throughout. Neurologic: Cranial nerves II through XII are grossly intact. No obvious focalities. Speech is fluent. Coordination is intact. Psychiatric: Judgment and insight are intact. Affect is appropriate. Pelvic exam deferred until simulation and treatment planning day  ECOG = 1   LABORATORY DATA:  Lab Results  Component Value Date   WBC 6.3 01/20/2016   HGB 12.1 01/20/2016   HCT 37.2 01/20/2016   MCV 86.5 01/20/2016   PLT 158 01/20/2016   NEUTROABS 3.1 01/12/2016   Lab Results  Component Value Date   NA 140 01/20/2016   K  3.7 01/20/2016   CL 106 01/20/2016   CO2 25 01/20/2016   GLUCOSE 128* 01/20/2016   CREATININE 0.78 01/20/2016   CALCIUM 8.6* 01/20/2016     RADIOGRAPHY: Dg Chest 2 View  01/12/2016  CLINICAL DATA:  Preoperative hysterectomy for endometrial carcinoma. History of tobacco use. EXAM: CHEST  2 VIEW COMPARISON:  November 09, 2014 FINDINGS: Lungs are clear. Heart size and pulmonary vascularity are normal. No adenopathy. No bone lesions. There are surgical clips in the right upper quadrant of the abdomen. IMPRESSION: No edema or consolidation.  No adenopathy evident. Electronically Signed   By: Lowella Grip III M.D.   On: 01/12/2016 09:43   Ct Chest W Contrast  02/02/2016  CLINICAL DATA:  Endometrial carcinoma diagnosed 1 month prior. Chemotherapy and radiation therapy planned. Initial evaluation. EXAM: CT CHEST, ABDOMEN, AND PELVIS WITH CONTRAST TECHNIQUE: Multidetector CT imaging of the chest, abdomen and pelvis was performed following the standard protocol during bolus administration of intravenous contrast. CONTRAST:  134mL ISOVUE-300 IOPAMIDOL (ISOVUE-300) INJECTION 61% COMPARISON:  CT 10/03/2008 FINDINGS: CT CHEST FINDINGS Mediastinum/Nodes: No axillary or supraclavicular lymphadenopathy. No mediastinal hilar lymphadenopathy. No pericardial  fluid. Esophagus normal. Lungs/Pleura: No pulmonary nodularity. Airways are normal. Normal pleural surface. Musculoskeletal: No aggressive osseous lesion. CT ABDOMEN AND PELVIS FINDINGS Hepatobiliary: No focal hepatic lesion. Postcholecystectomy. Common bile duct is mildly dilated but decreased in diameter compared to prior. Small amount free fluid surrounds the liver margin as well as the splenic margin. Pancreas: No pancreatic lesion. Spleen: Normal spleen. Small amount fluid along the margin of the spleen. Adrenals/urinary tract: Adrenal glands and kidneys are normal. The ureters and bladder normal. Stomach/Bowel: Stomach, small bowel, appendix, and cecum are normal.  The colon and rectosigmoid colon are normal. Vascular/Lymphatic: Abdominal aorta is normal caliber. There is no retroperitoneal or periportal lymphadenopathy. No pelvic lymphadenopathy. Reproductive: Post hysterectomy. 2 small nodules in the region the LEFT adnexa superior to the LEFT aspect of the vaginal cuff measure 4 mm and 7 mm on image 114, series 2. Similar nodules seen on comparison CT 2010 suggests possible venous structures. There is a low-density ovoid structure along the LEFT external iliac artery measuring 2.5 x 2.0 cm which may represent postsurgical fluid collection. The ovaries are not identified. No pelvic lymphadenopathy. Other: Moderate volume free fluid the pelvis and peritoneal space Musculoskeletal: No aggressive osseous lesion. IMPRESSION: Chest Impression: No evidence of thoracic metastasis Abdomen / Pelvis Impression: 1. No evidence of local metastasis in the pelvis. 2. Low-density ovoid structure along the LEFT obturator fossa may represent a small postsurgical fluid collection. 3. Small to moderate volume intraperitoneal fluid in the abdomen pelvis. 4. No lymphadenopathy. Electronically Signed   By: Suzy Bouchard M.D.   On: 02/02/2016 17:10   Ct Abdomen Pelvis W Contrast  02/02/2016  CLINICAL DATA:  Endometrial carcinoma diagnosed 1 month prior. Chemotherapy and radiation therapy planned. Initial evaluation. EXAM: CT CHEST, ABDOMEN, AND PELVIS WITH CONTRAST TECHNIQUE: Multidetector CT imaging of the chest, abdomen and pelvis was performed following the standard protocol during bolus administration of intravenous contrast. CONTRAST:  184mL ISOVUE-300 IOPAMIDOL (ISOVUE-300) INJECTION 61% COMPARISON:  CT 10/03/2008 FINDINGS: CT CHEST FINDINGS Mediastinum/Nodes: No axillary or supraclavicular lymphadenopathy. No mediastinal hilar lymphadenopathy. No pericardial fluid. Esophagus normal. Lungs/Pleura: No pulmonary nodularity. Airways are normal. Normal pleural surface. Musculoskeletal: No  aggressive osseous lesion. CT ABDOMEN AND PELVIS FINDINGS Hepatobiliary: No focal hepatic lesion. Postcholecystectomy. Common bile duct is mildly dilated but decreased in diameter compared to prior. Small amount free fluid surrounds the liver margin as well as the splenic margin. Pancreas: No pancreatic lesion. Spleen: Normal spleen. Small amount fluid along the margin of the spleen. Adrenals/urinary tract: Adrenal glands and kidneys are normal. The ureters and bladder normal. Stomach/Bowel: Stomach, small bowel, appendix, and cecum are normal. The colon and rectosigmoid colon are normal. Vascular/Lymphatic: Abdominal aorta is normal caliber. There is no retroperitoneal or periportal lymphadenopathy. No pelvic lymphadenopathy. Reproductive: Post hysterectomy. 2 small nodules in the region the LEFT adnexa superior to the LEFT aspect of the vaginal cuff measure 4 mm and 7 mm on image 114, series 2. Similar nodules seen on comparison CT 2010 suggests possible venous structures. There is a low-density ovoid structure along the LEFT external iliac artery measuring 2.5 x 2.0 cm which may represent postsurgical fluid collection. The ovaries are not identified. No pelvic lymphadenopathy. Other: Moderate volume free fluid the pelvis and peritoneal space Musculoskeletal: No aggressive osseous lesion. IMPRESSION: Chest Impression: No evidence of thoracic metastasis Abdomen / Pelvis Impression: 1. No evidence of local metastasis in the pelvis. 2. Low-density ovoid structure along the LEFT obturator fossa may represent a small postsurgical fluid collection.  3. Small to moderate volume intraperitoneal fluid in the abdomen pelvis. 4. No lymphadenopathy. Electronically Signed   By: Suzy Bouchard M.D.   On: 02/02/2016 17:10     IMPRESSION: The patient is diagnosed with Stage IIIC1 high grade endometrioid adenocarcinoma. The patient is an appropriate candidate for adjuvant radiation treatment .  External beam pelvic and PA  radiation with cisplatin 50mg /m2 on days 1 and 28. After, vaginal brachytherapy concurrently within 4 cycles of carboplatin and paclitaxel.   PLAN:Today, I talked to the patient and family about the findings and work-up thus far.  We discussed the natural history of endometrioid adenocarcinoma and general treatment, highlighting the role of radiotherapy in the management.  We discussed the available radiation techniques, and focused on the details of logistics and delivery.  We reviewed the anticipated acute and late sequelae associated with radiation in this setting.  The patient was encouraged to ask questions that I answered to the best of my ability.  The patient does wish to proceed with postoperative adjuvant treatment as above. Her CT scans today showed no evidence of distant recurrence or residual disease after surgery. The patient will be scheduled for simulation and planning so that she can start her treatment approximately 6 weeks postop.  Consult with Dr. Marko Plume on 02/07/16      ------------------------------------------------  Blair Promise, PhD, MD  This document serves as a record of services personally performed by Gery Pray, MD. It was created on his behalf by Derek Mound, a trained medical scribe. The creation of this record is based on the scribe's personal observations and the provider's statements to them. This document has been checked and approved by the attending provider.

## 2016-02-03 ENCOUNTER — Other Ambulatory Visit: Payer: Self-pay | Admitting: *Deleted

## 2016-02-03 DIAGNOSIS — C541 Malignant neoplasm of endometrium: Secondary | ICD-10-CM

## 2016-02-06 ENCOUNTER — Other Ambulatory Visit: Payer: Self-pay | Admitting: Oncology

## 2016-02-06 DIAGNOSIS — C541 Malignant neoplasm of endometrium: Secondary | ICD-10-CM

## 2016-02-07 ENCOUNTER — Encounter: Payer: Self-pay | Admitting: Oncology

## 2016-02-07 ENCOUNTER — Ambulatory Visit (HOSPITAL_BASED_OUTPATIENT_CLINIC_OR_DEPARTMENT_OTHER): Payer: 59

## 2016-02-07 ENCOUNTER — Ambulatory Visit (HOSPITAL_BASED_OUTPATIENT_CLINIC_OR_DEPARTMENT_OTHER): Payer: 59 | Admitting: Oncology

## 2016-02-07 VITALS — BP 158/73 | HR 87 | Temp 98.3°F | Resp 18 | Ht 66.0 in | Wt 207.9 lb

## 2016-02-07 DIAGNOSIS — C541 Malignant neoplasm of endometrium: Secondary | ICD-10-CM

## 2016-02-07 DIAGNOSIS — Z809 Family history of malignant neoplasm, unspecified: Secondary | ICD-10-CM | POA: Diagnosis not present

## 2016-02-07 DIAGNOSIS — Z9049 Acquired absence of other specified parts of digestive tract: Secondary | ICD-10-CM

## 2016-02-07 DIAGNOSIS — Z720 Tobacco use: Secondary | ICD-10-CM

## 2016-02-07 DIAGNOSIS — Z9109 Other allergy status, other than to drugs and biological substances: Secondary | ICD-10-CM

## 2016-02-07 LAB — COMPREHENSIVE METABOLIC PANEL
ALT: 30 U/L (ref 0–55)
ANION GAP: 8 meq/L (ref 3–11)
AST: 21 U/L (ref 5–34)
Albumin: 4.1 g/dL (ref 3.5–5.0)
Alkaline Phosphatase: 58 U/L (ref 40–150)
BILIRUBIN TOTAL: 0.73 mg/dL (ref 0.20–1.20)
BUN: 17.2 mg/dL (ref 7.0–26.0)
CALCIUM: 9.3 mg/dL (ref 8.4–10.4)
CO2: 26 meq/L (ref 22–29)
Chloride: 107 mEq/L (ref 98–109)
Creatinine: 0.8 mg/dL (ref 0.6–1.1)
EGFR: 78 mL/min/{1.73_m2} — ABNORMAL LOW (ref >90)
GLUCOSE: 100 mg/dL (ref 70–140)
Potassium: 4.1 mEq/L (ref 3.5–5.1)
Sodium: 141 mEq/L (ref 136–145)
TOTAL PROTEIN: 7.3 g/dL (ref 6.4–8.3)

## 2016-02-07 LAB — CBC WITH DIFFERENTIAL/PLATELET
BASO%: 0.7 % (ref 0.0–2.0)
Basophils Absolute: 0 10*3/uL (ref 0.0–0.1)
EOS%: 7.2 % — ABNORMAL HIGH (ref 0.0–7.0)
Eosinophils Absolute: 0.4 10*3/uL (ref 0.0–0.5)
HEMATOCRIT: 40.9 % (ref 34.8–46.6)
HEMOGLOBIN: 13.6 g/dL (ref 11.6–15.9)
LYMPH#: 1.5 10*3/uL (ref 0.9–3.3)
LYMPH%: 27.4 % (ref 14.0–49.7)
MCH: 28.5 pg (ref 25.1–34.0)
MCHC: 33.3 g/dL (ref 31.5–36.0)
MCV: 85.7 fL (ref 79.5–101.0)
MONO#: 0.4 10*3/uL (ref 0.1–0.9)
MONO%: 6.8 % (ref 0.0–14.0)
NEUT%: 57.9 % (ref 38.4–76.8)
NEUTROS ABS: 3.2 10*3/uL (ref 1.5–6.5)
PLATELETS: 210 10*3/uL (ref 145–400)
RBC: 4.77 10*6/uL (ref 3.70–5.45)
RDW: 13.7 % (ref 11.2–14.5)
WBC: 5.6 10*3/uL (ref 3.9–10.3)

## 2016-02-07 LAB — MAGNESIUM: Magnesium: 2.2 mg/dl (ref 1.5–2.5)

## 2016-02-07 NOTE — Progress Notes (Signed)
[place] NEW PATIENT EVALUATION   Name: Amy Holmes Date: Feb 07, 2016  MRN: [id number] DOB: 10-25-1955  REFERRING PHYSICIAN: Everitt Amber cc Lucretia Kern, DO, Gery Pray, Marylynn Pearson, (M.Wert)   REASON FOR REFERRAL: IIIC1 high grade endometrial adenocarcinoma, for adjuvant therapy   HISTORY OF PRESENT ILLNESS:Amy Holmes is a 60 y.o. female who is seen in consultation, alone for visit, at the request of Dr Denman George due to recently diagnosed IIIC1 grade 3  endometrial adenocarcinoma. Recommendation from gyn oncology is for CDDP with days 1 and 28 of pelvic RT, then 4 cycles of carboplatin taxol with vaginal brachytherapy.   Patient had postmenopausal spotting 09-2014, with gyn exam and PAP then negative. She had 3 days of spotting in Sept 2016, then slightly heavier spotting in 12-2015. US showed thickened endometrial stripe with normal size uterus, and 1.3 cm dermoid on left ovary. Hysteroscopy D&C 12-27-15 found grade 1 endometrial carcinoma. She was referred to Dr Denman George, with robotic assisted total hysterectomy with BSO and sentinel node evaluation done 01-19-16. Pathology [phone]) found grade 3 endometrial adenocarcinoma (poorly differentiated arising from well differentiated adenocarcinoma), 7.2 cm tumor invading into outer half of myometrium with benign cervix, tubes, ovaries. No definite LVSI. 1 of 8 right pelvic nodes with isolated tumor cell, 1 left pelvic node + in addition to 1 left pelvic node with isolated tumor cells and 4 left pelvic nodes negative. Patient was DC home within 24 hours, no prophylactic lovenox at DC.  GIven grade 3 pathology, she had CT CAP 02-02-16 on 02-02-16, with no clear distant disease and no obvious residual pelvic disease.  Recommendation is for pelvic radiation with CDDP days 1 and 28, then 4 cycles of carboplatin taxol chemotherapy. She attended chemotherapy teaching class, which included CDDP, carboplatin and taxol.  She saw Dr Sondra Come in  consultation on 02-02-16, expects to begin radiation ~ 6 weeks post op (not scheduled yet in EMR).  She will see Dr Denman George again on 02-13-16.  REVIEW OF SYSTEMS  Minimal constipation just after surgery, did not need pain medication. No nausea including just after anesthesia. No fever or symptoms of infection. No LE swelling. No bleeding. No SOB or other respiratory symptoms.  No headaches. Good visual acuity with glasses. No difficulty hearing. Has not seen dentist in ~ 5 years, no dental discomfort or concerns. No history of thyroid problems. Year round marked post nasal drainage, has tried several allergy medications without improvement. No changes in breasts, last mammograms in mobile unit several years ago. Usual weight ~ 193 lbs. Appetite good, no nausea, no GERD. Chronic problem with sleep, can fall asleep easily, then wakens after 5-6 hrs and stays awake for ~ 2 hrs before again asleep. No bladder symptoms. No other bleeding. No peripheral neuropathy symptoms. No LE swelling. No difficulty with IV access to date.    Remainder of full 10 point review of systems negative.   ALLERGIES: Review of patient's allergies indicates no known allergies.  PAST MEDICAL/ SURGICAL HISTORY:    G3 Lumbar disc surgery x2 ~ 2007 by orthopedics BTL remotely Cholecystectomy 2007 Last mammograms possibly >8 yrs ago in mobile unit when she worked for [employer] Never colonoscopy Holter 02-2013 with PVCs, PACs, short SVT Pulmonary evaluation 11-2014 for cough thought chronic irritation  CURRENT MEDICATIONS: reviewed as listed now in EMR Zofran, compazine prescriptions  SOCIAL HISTORY:  From [place], in [place] x 25 years. Married. 2 sons local, 1 daughter in [place], no grands. Works  real estate desk job in office x 8 years, wants to go back to work as soon as possible. Cigarettes ~ 3 daily after meals x 20 years, did stop 2015 but resumed. Occasional ETOH. Likes to boat and jet ski on [employer] (no jet ski now per Dr  Denman George).  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    FAMILY HISTORY:   Mother DM and heart disease Daughter asthma Son adult onset DM Some cancer in maternal grandfather died 34         PHYSICAL EXAM:  height is 5' 6"  (1.676 m) and weight is 207 lb 14.4 oz (94.303 kg). Her oral temperature is 98.3 F (36.8 C). Her blood pressure is 158/73 and her pulse is 87. Her respiration is 18 and oxygen saturation is 100%.  Alert, pleasant, cooperative lady, looks stated age, well-nourished, in no distress. Respirations not labored RA  HEENT:normal hair pattern. PERRL, not icteric. Oral mucosa moist and clear, amalgam type dental fillings, no acute dental problems obvious. Neck supple without JVD or thyroid mass  RESPIRATORY: respirations not labored. Lungs clear to A and P  CARDIAC/ VASCULAR: heart RRR no gallop, clear heart sounds. Peripheral pulses symmetrical and intact. UE veins appear adequate for chemotherapy  ABDOMEN: soft, nontender, not distended, no appreciable HSM or mass. Incisions healing well. Some normal BS  LYMPH NODES:no cervical, supraclavicular, axillary or inguinal adenopathy  BREASTS:bilaterally without dominant mass, skin or nipple findings of concern  NEUROLOGIC: CN, motor, sensory, cerebellar nonfocal. PSYCH appropriate mood and affect  SKIN: mild sunburn neck, anterior chest, upper arms. No rash, ecchymoses, petechiae.  MUSCULOSKELETAL: back not tender. Muscle mass good and symmetrical. Extremities without swelling, cords, tenderness    LABORATORY DATA:  Results for orders placed or performed in visit on 02/07/16 (from the past 48 hour(s))  CBC with Differential     Status: Abnormal   Collection Time: 02/07/16 10:50 AM  Result Value Ref Range   WBC 5.6 3.9 - 10.3 10e3/uL   NEUT# 3.2 1.5 - 6.5 10e3/uL   HGB 13.6 11.6 - 15.9 g/dL   HCT 40.9 34.8 - 46.6 %   Platelets 210 145 - 400 10e3/uL   MCV 85.7 79.5 - 101.0 fL   MCH 28.5 25.1 - 34.0 pg   MCHC 33.3 31.5 - 36.0  g/dL   RBC 4.77 3.70 - 5.45 10e6/uL   RDW 13.7 11.2 - 14.5 %   lymph# 1.5 0.9 - 3.3 10e3/uL   MONO# 0.4 0.1 - 0.9 10e3/uL   Eosinophils Absolute 0.4 0.0 - 0.5 10e3/uL   Basophils Absolute 0.0 0.0 - 0.1 10e3/uL   NEUT% 57.9 38.4 - 76.8 %   LYMPH% 27.4 14.0 - 49.7 %   MONO% 6.8 0.0 - 14.0 %   EOS% 7.2 (H) 0.0 - 7.0 %   BASO% 0.7 0.0 - 2.0 %  Comprehensive metabolic panel     Status: Abnormal   Collection Time: 02/07/16 10:50 AM  Result Value Ref Range   Sodium 141 136 - 145 mEq/L   Potassium 4.1 3.5 - 5.1 mEq/L   Chloride 107 98 - 109 mEq/L   CO2 26 22 - 29 mEq/L   Glucose 100 70 - 140 mg/dl    Comment: Glucose reference range is for nonfasting patients. Fasting glucose reference range is 70- 100.   BUN 17.2 7.0 - 26.0 mg/dL   Creatinine 0.8 0.6 - 1.1 mg/dL   Total Bilirubin 0.73 0.20 - 1.20 mg/dL   Alkaline Phosphatase 58 40 -  150 U/L   AST 21 5 - 34 U/L   ALT 30 0 - 55 U/L   Total Protein 7.3 6.4 - 8.3 g/dL   Albumin 4.1 3.5 - 5.0 g/dL   Calcium 9.3 8.4 - 10.4 mg/dL   Anion Gap 8 3 - 11 mEq/L   EGFR 78 (L) >90 ml/min/1.73 m2    Comment: eGFR is calculated using the CKD-EPI Creatinine Equation (2009)  Magnesium     Status: None   Collection Time: 02/07/16 10:50 AM  Result Value Ref Range   Magnesium 2.2 1.5 - 2.5 mg/dl      PATHOLOGY: FINAL for ELIZIBETH, BREAU ([id number]) Patient: NETTIE, WYFFELS Collected: 01/19/2016 Client: [facility] Accession: [id number] Received: 01/19/2016 Everitt Amber, MD REPORT OF SURGICAL PATHOLOGY FINAL DIAGNOSIS Diagnosis 1. Lymph nodes, regional resection, right pelvic - ONE LYMPH NODE SHOWING ISOLATED TUMOR CELLS. - EIGHT ADDITIONAL BENIGN LYMPH NODES WITH NO TUMOR SEEN (0/8). 2. Lymph nodes, regional resection, left pelvic - ONE LYMPH NODE POSITIVE FOR METASTATIC CARCINOMA (1/1). - ONE LYMPH NODE SHOWING ISOLATED TUMOR CELLS. - FOUR ADDITIONAL BENIGN LYMPH NODES WITH NO TUMOR SEEN (0/4). 3. Uterus +/- tubes/ovaries,  neoplastic, cervix - POORLY DIFFERENTIATED ADENOCARCINOMA (FIGO GRADE 3) ARISING FROM A WELL DIFFERENTIATED ENDOMETRIOID ADENOCARCINOMA. - TUMOR MEASURES 7.2 CM IN GREATEST DIMENSION. - TUMOR INVADES INTO OUTER HALF OF MYOMETRIUM BUT DOES NOT INVOLVE THE CERVIX. - SEE ONCOLOGY TEMPLATE. ADDITIONAL FINDINGS: - MYOMETRIUM ALSO DEMONSTRATES LEIOMYOMA FORMATION. - BENIGN CERVIX. - BENIGN BILATERAL OVARIES. - BENIGN BILATERAL FALLOPIAN TUBES. Microscopic Comment 3. ONCOLOGY TABLE-UTERUS, CARCINOMA OR CARCINOSARCOMA Specimen: Uterus with bilateral ovaries and fallopian tubes; lymph nodes. Procedure: Robotic assisted laparoscopic total hysterectomy with bilateral salpingo-oophorectomy. Lymph node sampling performed: Yes Specimen integrity: Intact. Maximum tumor size: 7.2 cm Histologic type: Poorly differentiated adenocarcinoma arising from a well differentiated endometrioid adenocarcinoma. Grade: FIGO grade 3. Myometrial invasion: 1.3 cm where myometrium is 2.4 cm in thickness Cervical stromal involvement: Not identified. Extent of involvement of other organs: Other organs are not identified. Lymph - vascular invasion: Definitive lymph/vascular invasion is not identified. Peritoneal washings: Not received. Lymph nodes: # examined 9 right pelvic lymph nodes and 6 left pelvic lymph nodes; # positive: 1 lymph node on right shows isolated tumor cells; 1 lymph node on the left shows isolated tumor cells; 1 lymph node on the left shows metastatic carcinoma. Pelvic lymph nodes: 1 lymph node on right shows isolated tumor cells; 1 lymph node on the left shows isolated tumor cells; 1 lymph node on the left shows metastatic carcinoma. Para-aortic lymph nodes: No para-aortic lymph nodes received. Other (specify involvement and site): Not applicable. TNM code: pT1b, pN1. FIGO Stage (based on pathologic findings, needs clinical correlation): IIIC1. Comments: A cytokeratin AE1/AE3 immunohistochemical  stain is performed on all lymph node tissue (12 stains total). A cytokeratin AE1/AE3, CD10, smooth muscle actin and desmin immunohistochemical stain is also performed on representative tumor. The staining pattern is consistent with the above findings.  RADIOGRAPHY: EXAM: CT CHEST, ABDOMEN, AND PELVIS WITH CONTRAST 02-02-16  COMPARISON: CT 10/03/2008  FINDINGS: CT CHEST FINDINGS  Mediastinum/Nodes: No axillary or supraclavicular lymphadenopathy. No mediastinal hilar lymphadenopathy. No pericardial fluid. Esophagus normal.  Lungs/Pleura: No pulmonary nodularity. Airways are normal. Normal pleural surface.  Musculoskeletal: No aggressive osseous lesion.  CT ABDOMEN AND PELVIS FINDINGS  Hepatobiliary: No focal hepatic lesion. Postcholecystectomy. Common bile duct is mildly dilated but decreased in diameter compared to prior.  Small amount free fluid surrounds the liver margin as well  as the splenic margin.  Pancreas: No pancreatic lesion.  Spleen: Normal spleen. Small amount fluid along the margin of the spleen.  Adrenals/urinary tract: Adrenal glands and kidneys are normal. The ureters and bladder normal.  Stomach/Bowel: Stomach, small bowel, appendix, and cecum are normal. The colon and rectosigmoid colon are normal.  Vascular/Lymphatic: Abdominal aorta is normal caliber. There is no retroperitoneal or periportal lymphadenopathy. No pelvic lymphadenopathy.  Reproductive: Post hysterectomy. 2 small nodules in the region the LEFT adnexa superior to the LEFT aspect of the vaginal cuff measure 4 mm and 7 mm on image 114, series 2. Similar nodules seen on comparison CT 2010 suggests possible venous structures.  There is a low-density ovoid structure along the LEFT external iliac artery measuring 2.5 x 2.0 cm which may represent postsurgical fluid collection. The ovaries are not identified. No pelvic lymphadenopathy.  Other: Moderate volume free fluid  the pelvis and peritoneal space  Musculoskeletal: No aggressive osseous lesion.  IMPRESSION: Chest Impression:  No evidence of thoracic metastasis  Abdomen / Pelvis Impression:  1. No evidence of local metastasis in the pelvis. 2. Low-density ovoid structure along the LEFT obturator fossa may represent a small postsurgical fluid collection. 3. Small to moderate volume intraperitoneal fluid in the abdomen pelvis. 4. No lymphadenopathy   CT images reviewed in PACS by MD. Discussed CT report with patient at time of visit   DISCUSSION: All of history above reviewed in detail with patient. Rationale for adjuvant chemotherapy and radiation therapy discussed. We have focused primarily on CDDP today, with plan to review carbo taxol prior to that treatment. We have discussed need for excellent hydration with CDDP, outpatient administration of chemotherapy, possible  Side effects including N/V, taste changes, fatigue, constipation, cytopenias. She understands that we will follow blood counts and other symptoms, and that she can call at any time if questions or concerns. She is in agreement with peripheral IV access for the chemo.   Verbal consent given.  Patient would like to return to work for next 1-2 weeks prior to start of treatment. I have spoken with Dr Clabe Seal staff to request notification when radiation start date is determined;    IMPRESSION / PLAN:   1.IIIC1 poorly differentiated endometrial adenocarcinoma arising in well differentiated endometrial adenocarcinoma: post complete debulking by Dr Denman George 01-19-16. Expect to give CDDP days 1 and 28 with pelvic radiation then 4 cycles of carboplatin taxol. Appointments to be scheduled when radiation dates known. 2.long minimal tobacco abuse: counseled for tobacco cessation, will continue to address 3.overdue mammograms which we will try to address at least after completion of upcoming treatment. 4.post cholecystectomy and lumbar disc  surgery x2 5.no colonoscopy, which will also be addressed as possible 6.environmental allergies 7.benign arrhythmias on Holter 2014    Patient had questions answered to her satisfaction and is in agreement with recommendations and plans above. She can contact this office for questions or concerns at any time prior to next scheduled visit. Chemo orders and granix orders entered for CDDP, managed care notified. Will coordinate scheduling as noted. RN to follow up with patient to review CDDP oral prehydration prior to first treatment.  Route PCP, cc Drs Denman George, Norton Blizzard  Time spent 55 min , including >50% discussion and coordination of care.    Naphtali Zywicki P, MD 02/07/2016 2:20 PM

## 2016-02-08 ENCOUNTER — Ambulatory Visit: Payer: 59

## 2016-02-08 ENCOUNTER — Ambulatory Visit: Payer: 59 | Admitting: Radiation Oncology

## 2016-02-08 DIAGNOSIS — C541 Malignant neoplasm of endometrium: Secondary | ICD-10-CM

## 2016-02-08 DIAGNOSIS — Z9109 Other allergy status, other than to drugs and biological substances: Secondary | ICD-10-CM | POA: Insufficient documentation

## 2016-02-08 DIAGNOSIS — Z9049 Acquired absence of other specified parts of digestive tract: Secondary | ICD-10-CM

## 2016-02-08 HISTORY — DX: Malignant neoplasm of endometrium: C54.1

## 2016-02-08 HISTORY — DX: Acquired absence of other specified parts of digestive tract: Z90.49

## 2016-02-13 ENCOUNTER — Encounter: Payer: Self-pay | Admitting: Gynecologic Oncology

## 2016-02-13 ENCOUNTER — Ambulatory Visit: Payer: 59 | Attending: Gynecologic Oncology | Admitting: Gynecologic Oncology

## 2016-02-13 ENCOUNTER — Other Ambulatory Visit: Payer: Self-pay | Admitting: Oncology

## 2016-02-13 VITALS — BP 181/77 | HR 76 | Temp 98.2°F | Resp 18 | Ht 66.0 in | Wt 209.5 lb

## 2016-02-13 DIAGNOSIS — G4700 Insomnia, unspecified: Secondary | ICD-10-CM | POA: Insufficient documentation

## 2016-02-13 DIAGNOSIS — Z818 Family history of other mental and behavioral disorders: Secondary | ICD-10-CM | POA: Insufficient documentation

## 2016-02-13 DIAGNOSIS — Z90710 Acquired absence of both cervix and uterus: Secondary | ICD-10-CM | POA: Insufficient documentation

## 2016-02-13 DIAGNOSIS — Z7982 Long term (current) use of aspirin: Secondary | ICD-10-CM | POA: Insufficient documentation

## 2016-02-13 DIAGNOSIS — C541 Malignant neoplasm of endometrium: Secondary | ICD-10-CM | POA: Diagnosis not present

## 2016-02-13 DIAGNOSIS — Z8249 Family history of ischemic heart disease and other diseases of the circulatory system: Secondary | ICD-10-CM | POA: Insufficient documentation

## 2016-02-13 DIAGNOSIS — Z7189 Other specified counseling: Secondary | ICD-10-CM

## 2016-02-13 DIAGNOSIS — Z809 Family history of malignant neoplasm, unspecified: Secondary | ICD-10-CM | POA: Diagnosis not present

## 2016-02-13 DIAGNOSIS — Z825 Family history of asthma and other chronic lower respiratory diseases: Secondary | ICD-10-CM | POA: Diagnosis not present

## 2016-02-13 DIAGNOSIS — Z833 Family history of diabetes mellitus: Secondary | ICD-10-CM | POA: Insufficient documentation

## 2016-02-13 DIAGNOSIS — L237 Allergic contact dermatitis due to plants, except food: Secondary | ICD-10-CM | POA: Diagnosis not present

## 2016-02-13 DIAGNOSIS — E669 Obesity, unspecified: Secondary | ICD-10-CM | POA: Insufficient documentation

## 2016-02-13 DIAGNOSIS — F418 Other specified anxiety disorders: Secondary | ICD-10-CM | POA: Diagnosis not present

## 2016-02-13 DIAGNOSIS — F17210 Nicotine dependence, cigarettes, uncomplicated: Secondary | ICD-10-CM | POA: Diagnosis not present

## 2016-02-13 DIAGNOSIS — Z9049 Acquired absence of other specified parts of digestive tract: Secondary | ICD-10-CM | POA: Diagnosis not present

## 2016-02-13 NOTE — Progress Notes (Signed)
Follow-up Note: Gyn-Onc  CC:  Chief Complaint  Patient presents with  . endometrial cancer    Post-Op follow up    Assessment/Plan:  [name]  is a 60 y.o.  year old with stage IIIC1 grade 3 endometrioid endometrial adenocarcinoma with positive left pelvic lymph node (macrometastasis). Recommendation is for chemoradiation with external beam radiation and cisplatin on days 1 and 28 followed by carboplatin and paclitaxel x 4 cycles. I recommend starting these at 6 weeks postop. She is healing well.  I discussed treatment course, prognosis, rationale behind therapy. I discussed that I will see her back after completing chemotherapy.  HPI: Amy Holmes is a 60 year old G3P3 who is seen in consultation at the request of Dr Julien Girt for grade 1 endometrial cancer. The patient had her first episode of postmenopausal bleeding in January 2016. This was worked up with a pap smear that was normal. She had another episode of spotting in 2016, and then again in March, 2017. At that time she presented to Dr Julien Girt office who performed an [place] which showed a normal size uterus but thickened endometrial stripe. A 1.3cm dermoid was identified on the left ovary.   A hysteroscopy and D&C was performed on 12/27/15 which showed grade 1 endometrial cancer.   Interval Hx: On 01/19/16 she underwent a robotic hysterectomy, BSO, SLN biopsy. She mapped only to the right pelvic nodes and a left completion lymphadenectomy was performed.  Final pathology revealed a stage IIIC1 grade 3 endometrial cancer (arising in a well differentiated cancer) with a 7.2cm tumor size, 1.3 of 2.4cm myometrial invasion (outer half) and no LVSI. The right SLN was negative but the left pelvic lymph node was positive for metastasis.   Postoperative imaging with CT chest abdo pelvis was performed on 02/02/16 and revealed a small left sided obturator lymphocyst but no evidence of metastatic disease.   Current Meds:  Outpatient Encounter  Prescriptions as of 02/13/2016  Medication Sig  . aspirin EC 81 MG tablet Take 81 mg by mouth daily.  Marland Kitchen CALCIUM CARBONATE PO Take 1 tablet by mouth daily.  . Coenzyme Q10 100 MG TABS Take 100 mg by mouth daily.  . vitamin C (ASCORBIC ACID) 500 MG tablet Take 500 mg by mouth daily.  Marland Kitchen zolpidem (AMBIEN) 5 MG tablet Take 1 tablet (5 mg total) by mouth at bedtime as needed for sleep. Do not take with pain meds  . fexofenadine (ALLEGRA) 180 MG tablet Take 180 mg by mouth daily as needed for allergies or rhinitis. Reported on 02/13/2016  . loratadine (CLARITIN) 10 MG tablet Take 10 mg by mouth daily as needed for allergies. Reported on 02/13/2016  . traMADol (ULTRAM) 50 MG tablet Take 1 tablet (50 mg total) by mouth every 6 (six) hours as needed for severe pain. (Patient not taking: Reported on 02/13/2016)   No facility-administered encounter medications on file as of 02/13/2016.    Allergy: No Known Allergies  Social Hx:   Social History   Social History  . Marital Status: Divorced    Spouse Name: N/A  . Number of Children: 3  . Years of Education: N/A   Occupational History  . HR     Social History Main Topics  . Smoking status: Current Every Day Smoker -- 0.25 packs/day for 20 years    Types: Cigarettes    Last Attempt to Quit: 08/10/2014  . Smokeless tobacco: Never Used     Comment: 3 cig per day   .  Alcohol Use: 0.0 oz/week    0 Standard drinks or equivalent per week     Comment: 1 drink here and there  . Drug Use: No  . Sexual Activity: Not on file   Other Topics Concern  . Not on file   Social History Narrative   Work or School:      Home Situation:      Spiritual Beliefs:      Lifestyle:             Past Surgical Hx:  Past Surgical History  Procedure Laterality Date  . Lumbar disc surgery      x 2  . Tubal ligation    . Cholecystectomy  2007  . Robotic assisted total hysterectomy with bilateral salpingo oopherectomy N/A 01/19/2016    Procedure: XI ROBOTIC  ASSISTED TOTAL HYSTERECTOMY WITH BILATERAL SALPINGO OOPHORECTOMY;  Surgeon: Everitt Amber, MD;  Location: [facility];  Service: Gynecology;  Laterality: N/A;  . Lymph node dissection N/A 01/19/2016    Procedure: sentienal LYMPH NODE mapping, bilateral pelvic lymph node dissection;  Surgeon: Everitt Amber, MD;  Location: [facility];  Service: Gynecology;  Laterality: N/A;    Past Medical Hx:  Past Medical History  Diagnosis Date  . Obesity   . Tobacco abuse   . Palpitations     2 months ago, stopped with energy drinks stopped  . Anxiety and depression     situational  . Insomnia   . [id number] 12/2015    endometrial    Past Gynecological History:  SVD x 3  No LMP recorded. Patient has had a hysterectomy.  Family Hx:  Family History  Problem Relation Age of Onset  . Heart disease Mother   . Diabetes Mother   . Depression Maternal Grandmother   . Depression Maternal Grandfather   . Asthma Son   . Asthma Daughter   . Cancer Maternal Grandfather     unknown type    Review of Systems:  Constitutional  Feels well,    ENT Normal appearing ears and nares bilaterally Skin/Breast  No rash, sores, jaundice, itching, dryness Cardiovascular  No chest pain, shortness of breath, or edema  Pulmonary  No cough or wheeze.  Gastro Intestinal  No nausea, vomitting, or diarrhoea. No bright red blood per rectum, no abdominal pain, change in bowel movement, or constipation.  Genito Urinary  No frequency, urgency, dysuria, + postmenopausal bleeding Musculo Skeletal  No myalgia, arthralgia, joint swelling or pain  Neurologic  No weakness, numbness, change in gait,  Psychology  No depression, anxiety, insomnia.   Vitals:  Blood pressure 181/77, pulse 76, temperature 98.2 F (36.8 C), temperature source Oral, resp. rate 18, height 5\' 6"  (1.676 m), weight 209 lb 8 oz (95.029 kg), SpO2 100 %.  Physical Exam: WD in NAD Neck  Supple NROM, without any enlargements.  Lymph Node Survey No cervical  supraclavicular or inguinal adenopathy Cardiovascular  Pulse normal rate, regularity and rhythm. S1 and S2 normal.  Lungs  Clear to auscultation bilateraly, without wheezes/crackles/rhonchi. Good air movement.  Skin  Poison ivy rash on left shoulder Psychiatry  Alert and oriented to person, place, and time  Abdomen  Normoactive bowel sounds, abdomen soft, non-tender and overweight without evidence of hernia. Well healed incisions.  Back No CVA tenderness Genito Urinary  Normal vagina, cuff healing normally, no blood, surgically absent uterus and cervix. No masses Rectal  Good tone, no masses no cul de sac nodularity.  Extremities  No bilateral cyanosis, clubbing or  edema.   30 minutes of direct face to face counseling time was spent with the patient. This included discussion about prognosis, therapy recommendations and postoperative side effects and are beyond the scope of routine postoperative care.  Donaciano Eva, MD  02/13/2016, 3:17 PM

## 2016-02-13 NOTE — Patient Instructions (Signed)
Plan to follow up after completion of treatment.  Please call for any questions or concerns.

## 2016-02-15 ENCOUNTER — Telehealth: Payer: Self-pay

## 2016-02-15 DIAGNOSIS — G4700 Insomnia, unspecified: Secondary | ICD-10-CM

## 2016-02-15 MED ORDER — ZOLPIDEM TARTRATE 5 MG PO TABS: 5.0000 mg | ORAL_TABLET | Freq: Every evening | ORAL | Status: DC | PRN

## 2016-02-15 NOTE — Telephone Encounter (Signed)
[name] , Amy Holmes, APNP updated. Refill request approved Ambien 5 MG PO , QTY: 15 , no refills , prescription called in to [facility].

## 2016-02-16 ENCOUNTER — Ambulatory Visit
Admission: RE | Admit: 2016-02-16 | Discharge: 2016-02-16 | Disposition: A | Discharge status: Home / Self Care | Payer: 59 | Source: Ambulatory Visit | Attending: Radiation Oncology | Admitting: Radiation Oncology

## 2016-02-16 ENCOUNTER — Other Ambulatory Visit: Payer: Self-pay | Admitting: Oncology

## 2016-02-16 DIAGNOSIS — Z510 Encounter for antineoplastic radiation therapy: Secondary | ICD-10-CM | POA: Diagnosis not present

## 2016-02-16 DIAGNOSIS — C541 Malignant neoplasm of endometrium: Secondary | ICD-10-CM

## 2016-02-16 MED ORDER — LORAZEPAM 1 MG PO TABS: ORAL_TABLET | ORAL | Status: DC

## 2016-02-16 MED ORDER — PROCHLORPERAZINE MALEATE 10 MG PO TABS: 10.0000 mg | ORAL_TABLET | Freq: Four times a day (QID) | ORAL | Status: DC | PRN

## 2016-02-23 NOTE — Progress Notes (Signed)
  Radiation Oncology         [phone] ________________________________  Name: Amy Holmes MRN: [id number]  Date: 02/16/2016  DOB: Mar 13, 1956  SIMULATION AND TREATMENT PLANNING NOTE    ICD-9-CM ICD-10-CM   1. International Federation of Gynecology and Obstetrics (FIGO) stage IIIC1 malignant neoplasm of endometrium ([place]) 182.0 C54.1     DIAGNOSIS:  stage IIIC1 grade 3 endometrioid endometrial adenocarcinoma with positive left pelvic lymph node (macrometastasis).  NARRATIVE:  The patient was brought to the [facility].  Identity was confirmed.  All relevant records and images related to the planned course of therapy were reviewed.  The patient freely provided informed written consent to proceed with treatment after reviewing the details related to the planned course of therapy. The consent form was witnessed and verified by the simulation staff.  Then, the patient was set-up in a stable reproducible  supine position for radiation therapy.  CT images were obtained.  Surface markings were placed.  The CT images were loaded into the planning software.  Then the target and avoidance structures were contoured.  Treatment planning then occurred.  The radiation prescription was entered and confirmed.  Then, I designed and supervised the construction of a total of 1 medically necessary complex treatment devices.  I have requested : Intensity Modulated Radiotherapy (IMRT) is medically necessary for this case for the following reason:  Small bowel sparing..  I have ordered:CBC  PLAN:  The patient will receive 45 Gy in 25 fractions directed at the pelvis and low periaortic region Followed by 3 intracavitary brachytherapy treatments directed at the vaginal cuff using iridium 192 as the high-dose-rate source.  ________________________________  -----------------------------------  Blair Promise, PhD, MD

## 2016-02-27 ENCOUNTER — Ambulatory Visit: Payer: 59

## 2016-02-28 ENCOUNTER — Ambulatory Visit: Payer: 59

## 2016-02-28 DIAGNOSIS — Z510 Encounter for antineoplastic radiation therapy: Secondary | ICD-10-CM | POA: Diagnosis not present

## 2016-02-29 ENCOUNTER — Telehealth: Payer: Self-pay

## 2016-02-29 ENCOUNTER — Ambulatory Visit: Payer: 59

## 2016-02-29 DIAGNOSIS — Z510 Encounter for antineoplastic radiation therapy: Secondary | ICD-10-CM | POA: Diagnosis not present

## 2016-02-29 DIAGNOSIS — G4700 Insomnia, unspecified: Secondary | ICD-10-CM

## 2016-02-29 MED ORDER — ZOLPIDEM TARTRATE 5 MG PO TABS: 5.0000 mg | ORAL_TABLET | Freq: Every evening | ORAL | Status: DC | PRN

## 2016-02-29 NOTE — Telephone Encounter (Signed)
Patient's husband call requesting a refill on the patient's Ambien 5 MG PO Q HS. Melissa Cross , APNP updated , refill request was approved for Ambien 5 MG PO QHS PRN for insomnia, QTY 15 , no refills. Updated Mr Streifel that the refill request was approved for the Ambien , last refill , Mr Bystrom was instructed to follow up with Oncologist or Radiation Oncologist for additional refills and to discuss her insomnia. Mr [place] states understanding , denies further questions at this time .

## 2016-03-01 ENCOUNTER — Ambulatory Visit
Admission: RE | Admit: 2016-03-01 | Discharge: 2016-03-01 | Disposition: A | Discharge status: Home / Self Care | Payer: 59 | Source: Ambulatory Visit | Attending: Radiation Oncology | Admitting: Radiation Oncology

## 2016-03-01 DIAGNOSIS — Z510 Encounter for antineoplastic radiation therapy: Secondary | ICD-10-CM | POA: Diagnosis not present

## 2016-03-02 ENCOUNTER — Ambulatory Visit
Admission: RE | Admit: 2016-03-02 | Discharge: 2016-03-02 | Disposition: A | Discharge status: Home / Self Care | Payer: 59 | Source: Ambulatory Visit | Attending: Radiation Oncology | Admitting: Radiation Oncology

## 2016-03-02 ENCOUNTER — Telehealth: Payer: Self-pay | Admitting: *Deleted

## 2016-03-02 DIAGNOSIS — Z510 Encounter for antineoplastic radiation therapy: Secondary | ICD-10-CM | POA: Diagnosis not present

## 2016-03-02 NOTE — Telephone Encounter (Signed)
Left message for patient to drink five, 8 oz glasses of caffeine free fluid Sunday evening and again Monday prior to appt. To call if has questions

## 2016-03-05 ENCOUNTER — Ambulatory Visit
Admission: RE | Admit: 2016-03-05 | Discharge: 2016-03-05 | Disposition: A | Discharge status: Home / Self Care | Payer: 59 | Source: Ambulatory Visit | Attending: Radiation Oncology | Admitting: Radiation Oncology

## 2016-03-05 ENCOUNTER — Other Ambulatory Visit (HOSPITAL_COMMUNITY)
Admission: RE | Admit: 2016-03-05 | Discharge: 2016-03-05 | Disposition: A | Discharge status: Home / Self Care | Payer: 59 | Source: Ambulatory Visit | Attending: Gynecologic Oncology | Admitting: Gynecologic Oncology

## 2016-03-05 ENCOUNTER — Ambulatory Visit (HOSPITAL_BASED_OUTPATIENT_CLINIC_OR_DEPARTMENT_OTHER): Payer: 59

## 2016-03-05 ENCOUNTER — Encounter: Payer: Self-pay | Admitting: Gynecologic Oncology

## 2016-03-05 ENCOUNTER — Other Ambulatory Visit (HOSPITAL_BASED_OUTPATIENT_CLINIC_OR_DEPARTMENT_OTHER): Payer: 59

## 2016-03-05 DIAGNOSIS — C541 Malignant neoplasm of endometrium: Secondary | ICD-10-CM | POA: Insufficient documentation

## 2016-03-05 DIAGNOSIS — Z5111 Encounter for antineoplastic chemotherapy: Secondary | ICD-10-CM

## 2016-03-05 DIAGNOSIS — Z510 Encounter for antineoplastic radiation therapy: Secondary | ICD-10-CM | POA: Diagnosis not present

## 2016-03-05 LAB — COMPREHENSIVE METABOLIC PANEL
ALT: 21 U/L (ref 0–55)
AST: 15 U/L (ref 5–34)
Albumin: 4.2 g/dL (ref 3.5–5.0)
Alkaline Phosphatase: 57 U/L (ref 40–150)
Anion Gap: 11 mEq/L (ref 3–11)
BUN: 19.1 mg/dL (ref 7.0–26.0)
CHLORIDE: 108 meq/L (ref 98–109)
CO2: 22 meq/L (ref 22–29)
CREATININE: 0.8 mg/dL (ref 0.6–1.1)
Calcium: 9.6 mg/dL (ref 8.4–10.4)
EGFR: 78 mL/min/{1.73_m2} — ABNORMAL LOW (ref >90)
GLUCOSE: 125 mg/dL (ref 70–140)
Potassium: 4 mEq/L (ref 3.5–5.1)
Sodium: 140 mEq/L (ref 136–145)
Total Bilirubin: 0.68 mg/dL (ref 0.20–1.20)
Total Protein: 7.6 g/dL (ref 6.4–8.3)

## 2016-03-05 LAB — CBC WITH DIFFERENTIAL/PLATELET
BASO%: 0.8 % (ref 0.0–2.0)
Basophils Absolute: 0 10*3/uL (ref 0.0–0.1)
EOS%: 5.9 % (ref 0.0–7.0)
Eosinophils Absolute: 0.3 10*3/uL (ref 0.0–0.5)
HCT: 41.9 % (ref 34.8–46.6)
HGB: 13.7 g/dL (ref 11.6–15.9)
LYMPH%: 26.5 % (ref 14.0–49.7)
MCH: 27.6 pg (ref 25.1–34.0)
MCHC: 32.7 g/dL (ref 31.5–36.0)
MCV: 84.3 fL (ref 79.5–101.0)
MONO#: 0.3 10*3/uL (ref 0.1–0.9)
MONO%: 5.9 % (ref 0.0–14.0)
NEUT%: 60.9 % (ref 38.4–76.8)
NEUTROS ABS: 3 10*3/uL (ref 1.5–6.5)
Platelets: 186 10*3/uL (ref 145–400)
RBC: 4.98 10*6/uL (ref 3.70–5.45)
RDW: 14.2 % (ref 11.2–14.5)
WBC: 5 10*3/uL (ref 3.9–10.3)
lymph#: 1.3 10*3/uL (ref 0.9–3.3)

## 2016-03-05 LAB — MAGNESIUM: Magnesium: 2 mg/dl (ref 1.5–2.5)

## 2016-03-05 MED ORDER — SODIUM CHLORIDE 0.9 % IV SOLN
Freq: Once | INTRAVENOUS | Status: AC
  Administered 2016-03-05: 11:00:00 via INTRAVENOUS

## 2016-03-05 MED ORDER — SODIUM CHLORIDE 0.9 % IV SOLN
50.0000 mg/m2 | Freq: Once | INTRAVENOUS | Status: AC
  Administered 2016-03-05: 105 mg via INTRAVENOUS
  Filled 2016-03-05: qty 105

## 2016-03-05 MED ORDER — SODIUM CHLORIDE 0.9 % IV SOLN
Freq: Once | INTRAVENOUS | Status: AC
  Administered 2016-03-05: 13:00:00 via INTRAVENOUS
  Filled 2016-03-05: qty 5

## 2016-03-05 MED ORDER — MANNITOL 25 % IV SOLN
Freq: Once | INTRAVENOUS | Status: AC
  Administered 2016-03-05: 11:00:00 via INTRAVENOUS
  Filled 2016-03-05: qty 10

## 2016-03-05 MED ORDER — PALONOSETRON HCL INJECTION 0.25 MG/5ML
0.2500 mg | Freq: Once | INTRAVENOUS | Status: AC
  Administered 2016-03-05: 0.25 mg via INTRAVENOUS

## 2016-03-05 MED ORDER — PALONOSETRON HCL INJECTION 0.25 MG/5ML
INTRAVENOUS | Status: AC
  Filled 2016-03-05: qty 5

## 2016-03-05 NOTE — Patient Instructions (Signed)
[facility] Discharge Instructions for Patients Receiving Chemotherapy  Today you received the following chemotherapy agents Cisplatin  To help prevent nausea and vomiting after your treatment, we encourage you to take your nausea medication     If you develop nausea and vomiting that is not controlled by your nausea medication, call the clinic.   BELOW ARE SYMPTOMS THAT SHOULD BE REPORTED IMMEDIATELY:  *FEVER GREATER THAN 100.5 F  *CHILLS WITH OR WITHOUT FEVER  NAUSEA AND VOMITING THAT IS NOT CONTROLLED WITH YOUR NAUSEA MEDICATION  *UNUSUAL SHORTNESS OF BREATH  *UNUSUAL BRUISING OR BLEEDING  TENDERNESS IN MOUTH AND THROAT WITH OR WITHOUT PRESENCE OF ULCERS  *URINARY PROBLEMS  *BOWEL PROBLEMS  UNUSUAL RASH Items with * indicate a potential emergency and should be followed up as soon as possible.  Feel free to call the clinic you have any questions or concerns. The clinic phone number is [phone].  Please show the [facility] at check-in to the Emergency Department and triage nurse.  Cisplatin injection What is this medicine? CISPLATIN (SIS pla tin) is a chemotherapy drug. It targets fast dividing cells, like cancer cells, and causes these cells to die. This medicine is used to treat many types of cancer like bladder, ovarian, and testicular cancers. This medicine may be used for other purposes; ask your health care provider or pharmacist if you have questions. What should I tell my health care provider before I take this medicine? They need to know if you have any of these conditions: -blood disorders -hearing problems -kidney disease -recent or ongoing radiation therapy -an unusual or allergic reaction to cisplatin, carboplatin, other chemotherapy, other medicines, foods, dyes, or preservatives -pregnant or trying to get pregnant -breast-feeding How should I use this medicine? This drug is given as an infusion into a vein. It is administered  in a hospital or clinic by a specially trained health care professional. Talk to your pediatrician regarding the use of this medicine in children. Special care may be needed. Overdosage: If you think you have taken too much of this medicine contact a poison control center or emergency room at once. NOTE: This medicine is only for you. Do not share this medicine with others. What if I miss a dose? It is important not to miss a dose. Call your doctor or health care professional if you are unable to keep an appointment. What may interact with this medicine? -dofetilide -foscarnet -medicines for seizures -medicines to increase blood counts like filgrastim, pegfilgrastim, sargramostim -probenecid -pyridoxine used with altretamine -rituximab -some antibiotics like amikacin, gentamicin, neomycin, polymyxin B, streptomycin, tobramycin -sulfinpyrazone -vaccines -zalcitabine Talk to your doctor or health care professional before taking any of these medicines: -acetaminophen -aspirin -ibuprofen -ketoprofen -naproxen This list may not describe all possible interactions. Give your health care provider a list of all the medicines, herbs, non-prescription drugs, or dietary supplements you use. Also tell them if you smoke, drink alcohol, or use illegal drugs. Some items may interact with your medicine. What should I watch for while using this medicine? Your condition will be monitored carefully while you are receiving this medicine. You will need important blood work done while you are taking this medicine. This drug may make you feel generally unwell. This is not uncommon, as chemotherapy can affect healthy cells as well as cancer cells. Report any side effects. Continue your course of treatment even though you feel ill unless your doctor tells you to stop. In some cases, you may be given  additional medicines to help with side effects. Follow all directions for their use. Call your doctor or health  care professional for advice if you get a fever, chills or sore throat, or other symptoms of a cold or flu. Do not treat yourself. This drug decreases your body's ability to fight infections. Try to avoid being around people who are sick. This medicine may increase your risk to bruise or bleed. Call your doctor or health care professional if you notice any unusual bleeding. Be careful brushing and flossing your teeth or using a toothpick because you may get an infection or bleed more easily. If you have any dental work done, tell your dentist you are receiving this medicine. Avoid taking products that contain aspirin, acetaminophen, ibuprofen, naproxen, or ketoprofen unless instructed by your doctor. These medicines may hide a fever. Do not become pregnant while taking this medicine. Women should inform their doctor if they wish to become pregnant or think they might be pregnant. There is a potential for serious side effects to an unborn child. Talk to your health care professional or pharmacist for more information. Do not breast-feed an infant while taking this medicine. Drink fluids as directed while you are taking this medicine. This will help protect your kidneys. Call your doctor or health care professional if you get diarrhea. Do not treat yourself. What side effects may I notice from receiving this medicine? Side effects that you should report to your doctor or health care professional as soon as possible: -allergic reactions like skin rash, itching or hives, swelling of the face, lips, or tongue -signs of infection - fever or chills, cough, sore throat, pain or difficulty passing urine -signs of decreased platelets or bleeding - bruising, pinpoint red spots on the skin, black, tarry stools, nosebleeds -signs of decreased red blood cells - unusually weak or tired, fainting spells, lightheadedness -breathing problems -changes in hearing -gout pain -low blood counts - This drug may decrease the  number of white blood cells, red blood cells and platelets. You may be at increased risk for infections and bleeding. -nausea and vomiting -pain, swelling, redness or irritation at the injection site -pain, tingling, numbness in the hands or feet -problems with balance, movement -trouble passing urine or change in the amount of urine Side effects that usually do not require medical attention (report to your doctor or health care professional if they continue or are bothersome): -changes in vision -loss of appetite -metallic taste in the mouth or changes in taste This list may not describe all possible side effects. Call your doctor for medical advice about side effects. You may report side effects to FDA at 1-800-FDA-1088. Where should I keep my medicine? This drug is given in a hospital or clinic and will not be stored at home. NOTE: This sheet is a summary. It may not cover all possible information. If you have questions about this medicine, talk to your doctor, pharmacist, or health care provider.    2016, Elsevier/Gold Standard. (2007-12-02 14:40:54)

## 2016-03-05 NOTE — Progress Notes (Signed)
Gynecologic Oncology Multi-Disciplinary Disposition Conference Note  Date of the Conference: March 05, 2016  Patient Name: Amy Holmes  Referring Provider: Dr. Julien Girt Primary GYN Oncologist: Dr. Everitt Amber  Stage/Disposition:  Stage IIIC1 Gr 3 endometrioid adenocarcinoma.  Disposition is to chemoradiation followed by vaginal brachytherapy with 4 cycles carboplatin and taxol.   This Multidisciplinary conference took place involving physicians from [place], [place], Radiation Oncology, Pathology, Radiology along with the Gynecologic Oncology Nurse Practitioner and RN.  Comprehensive assessment of the patient's malignancy, staging, need for surgery, chemotherapy, radiation therapy, and need for further testing were reviewed. Supportive measures, both inpatient and following discharge were also discussed. The recommended plan of care is documented. Greater than 35 minutes were spent correlating and coordinating this patient's care.

## 2016-03-06 ENCOUNTER — Ambulatory Visit: Admission: RE | Admit: 2016-03-06 | Payer: 59 | Source: Ambulatory Visit | Admitting: Radiation Oncology

## 2016-03-06 ENCOUNTER — Telehealth: Payer: Self-pay

## 2016-03-06 ENCOUNTER — Ambulatory Visit: Admission: RE | Admit: 2016-03-06 | Payer: 59 | Source: Ambulatory Visit

## 2016-03-06 ENCOUNTER — Ambulatory Visit
Admission: RE | Admit: 2016-03-06 | Discharge: 2016-03-06 | Disposition: A | Discharge status: Home / Self Care | Payer: 59 | Source: Ambulatory Visit | Attending: Radiation Oncology | Admitting: Radiation Oncology

## 2016-03-06 DIAGNOSIS — Z510 Encounter for antineoplastic radiation therapy: Secondary | ICD-10-CM | POA: Diagnosis not present

## 2016-03-06 NOTE — Telephone Encounter (Signed)
S/w pt and she feels fine. "If it wasn't for being so thirsty I would not know I had chemo yesterday". She did say she is drinking plenty and eating and moving bowels OK. Also discussed decadron premed can make her hungry, stated she was starving yesterday after treatment. Discussed appts.

## 2016-03-06 NOTE — Telephone Encounter (Signed)
-----   Message from Gordy Levan, MD sent at 02/16/2016 11:52 AM EDT ----- She will start CDDP on 6-26 RN please review oral prehydration closer to start date  Scripts for : Compazine 10 mg q 6 hr prn nausea #30 Ativan 1 mg   1/2 - 1 po or SL every 6 hrs prn nausea  Will make drowsy  #20  [employer] always fine thanks

## 2016-03-07 ENCOUNTER — Encounter: Payer: Self-pay | Admitting: Radiation Oncology

## 2016-03-07 ENCOUNTER — Ambulatory Visit
Admission: RE | Admit: 2016-03-07 | Discharge: 2016-03-07 | Disposition: A | Discharge status: Home / Self Care | Payer: 59 | Source: Ambulatory Visit | Attending: Radiation Oncology | Admitting: Radiation Oncology

## 2016-03-07 VITALS — BP 185/94 | HR 81 | Temp 97.9°F | Ht 66.0 in | Wt 212.7 lb

## 2016-03-07 DIAGNOSIS — Z510 Encounter for antineoplastic radiation therapy: Secondary | ICD-10-CM | POA: Diagnosis not present

## 2016-03-07 DIAGNOSIS — C541 Malignant neoplasm of endometrium: Secondary | ICD-10-CM

## 2016-03-07 NOTE — Progress Notes (Signed)
Pt here for patient teaching.  Pt refused the Radiation and You booklet. Reviewed areas of pertinence such as diarrhea, fatigue, nausea and vomiting, skin changes and urinary and bladder changes . Pt able to give teach back of to pat skin, use unscented/gentle soap, have Imodium on hand and drink plenty of water,avoid applying anything to skin within 4 hours of treatment. Pt demonstrated understanding and verbalizes understanding of information given and will contact nursing with any questions or concerns.

## 2016-03-07 NOTE — Progress Notes (Signed)
Amy Holmes has completed 5 fractions to her pelvis.  She denies having pain.  She had chemotherapy on Monday.  She denies having any bladder/bowel issues, fatigue, vaginal/rectal bleeding or discharge.  BP 185/94 mmHg  Pulse 81  Temp(Src) 97.9 F (36.6 C) (Oral)  Ht 5\' 6"  (1.676 m)  Wt 212 lb 11.2 oz (96.48 kg)  BMI 34.35 kg/m2  SpO2 99%   Wt Readings from Last 3 Encounters:  03/07/16 212 lb 11.2 oz (96.48 kg)  02/16/16 208 lb 12.8 oz (94.711 kg)  02/13/16 209 lb 8 oz (95.029 kg)

## 2016-03-07 NOTE — Progress Notes (Signed)
  Radiation Oncology         [phone] ________________________________  Name: Amy Holmes MRN: [id number]  Date: 03/07/2016  DOB: 1956-05-05  Weekly Radiation Therapy Management    ICD-9-CM ICD-10-CM   1. Endometrial cancer (HCC) 182.0 C54.1      Current Dose: 9 Gy     Planned Dose:  45+ Gy  Narrative . . . . . . . . The patient presents for routine under treatment assessment.                            Amy Holmes has completed 5 fractions to her pelvis. She denies having pain, bladder/bowel issues, fatigue, or vaginal/rectal bleeding or discharge. She reports feeling thirsty all of the time and needs to eat more smaller and frequent meals. She had chemotherapy on Monday.                                 Set-up films were reviewed.                                 The chart was checked. Physical Findings. . .  height is 5\' 6"  (1.676 m) and weight is 212 lb 11.2 oz (96.48 kg). Her oral temperature is 97.9 F (36.6 C). Her blood pressure is 185/94 and her pulse is 81. Her oxygen saturation is 99%. . Weight essentially stable.  No significant changes. Lungs are clear to auscultation bilaterally. Heart has regular rate and rhythm. Abdomen soft, non-tender, normal bowel sounds.  Impression . . . . . . . The patient is tolerating radiation. Plan . . . . . . . . . . . . Continue treatment as planned.  ________________________________   Blair Promise, PhD, MD  This document serves as a record of services personally performed by Gery Pray, MD. It was created on his behalf by Darcus Austin, a trained medical scribe. The creation of this record is based on the scribe's personal observations and the provider's statements to them. This document has been checked and approved by the attending provider.

## 2016-03-08 ENCOUNTER — Ambulatory Visit
Admission: RE | Admit: 2016-03-08 | Discharge: 2016-03-08 | Disposition: A | Discharge status: Home / Self Care | Payer: 59 | Source: Ambulatory Visit | Attending: Radiation Oncology | Admitting: Radiation Oncology

## 2016-03-08 ENCOUNTER — Encounter (HOSPITAL_COMMUNITY): Payer: Self-pay

## 2016-03-08 DIAGNOSIS — Z510 Encounter for antineoplastic radiation therapy: Secondary | ICD-10-CM | POA: Diagnosis not present

## 2016-03-09 ENCOUNTER — Encounter: Payer: Self-pay | Admitting: Radiation Oncology

## 2016-03-09 ENCOUNTER — Ambulatory Visit
Admission: RE | Admit: 2016-03-09 | Discharge: 2016-03-09 | Disposition: A | Discharge status: Home / Self Care | Payer: 59 | Source: Ambulatory Visit | Attending: Radiation Oncology | Admitting: Radiation Oncology

## 2016-03-09 VITALS — BP 163/84 | HR 84 | Temp 98.2°F | Ht 66.0 in | Wt 208.6 lb

## 2016-03-09 DIAGNOSIS — C541 Malignant neoplasm of endometrium: Secondary | ICD-10-CM

## 2016-03-09 DIAGNOSIS — Z510 Encounter for antineoplastic radiation therapy: Secondary | ICD-10-CM | POA: Diagnosis not present

## 2016-03-09 NOTE — Progress Notes (Signed)
  Radiation Oncology         [phone] ________________________________  Name: Amy Holmes MRN: [id number]  Date: 03/09/2016  DOB: 13-Mar-1956  Weekly Radiation Therapy Management    ICD-9-CM ICD-10-CM   1. Endometrial cancer (HCC) 182.0 C54.1      Current Dose: 12.6 Gy     Planned Dose:  45+ Gy  Narrative . . . . . . . . The patient presents for routine under treatment assessment.                            Amy Holmes has completed 5 fractions to her pelvis. Amy Holmes is here for her 7th fraction of radiation to her Pelvis. She denies pain. She does have some fatigue, but is working currently. She will nap some days and other days she is able to get through the work day. She denies urinary symptoms. She reports having 3 loose stools since Wednesday evening. She imodium with relief. She denies vomiting, but does have some stomach upset at times. She is eating frequent small meals through out the day. She denies any skin issues to her Radiation site.                                  Set-up films were reviewed.                                 The chart was checked. Physical Findings. . .  height is 5\' 6"  (1.676 m) and weight is 208 lb 9.6 oz (94.62 kg). Her temperature is 98.2 F (36.8 C). Her blood pressure is 163/84 and her pulse is 84. .  Wt Readings from Last 3 Encounters:  03/09/16 208 lb 9.6 oz (94.62 kg)  03/07/16 212 lb 11.2 oz (96.48 kg)  02/16/16 208 lb 12.8 oz (94.711 kg)   Non toxic appearing. Ambulatory.  Impression . . . . . . . The patient is tolerating radiation. Plan . . . . . . . . . . . . Continue treatment as planned. Continue imodium AD PRN   ----------------------------------  Eppie Gibson, MD

## 2016-03-09 NOTE — Progress Notes (Signed)
Amy Holmes is here for her 7th fraction of radiation to her Pelvis. She denies pain. She does have some fatigue, but is working currently. She will nap some days and other days she is able to get through the work day. She denies urinary symptoms. She reports having 3 loose stools since Wednesday evening. She imodium with relief. She denies vomiting, but does have some stomach upset at times. She is eating frequent small meals through out the day. She denies any skin issues to her Radiation site.   BP 163/84 mmHg  Pulse 84  Temp(Src) 98.2 F (36.8 C)  Ht 5\' 6"  (1.676 m)  Wt 208 lb 9.6 oz (94.62 kg)  BMI 33.68 kg/m2   Wt Readings from Last 3 Encounters:  03/09/16 208 lb 9.6 oz (94.62 kg)  03/07/16 212 lb 11.2 oz (96.48 kg)  02/16/16 208 lb 12.8 oz (94.711 kg)

## 2016-03-11 ENCOUNTER — Other Ambulatory Visit: Payer: Self-pay | Admitting: Oncology

## 2016-03-12 ENCOUNTER — Ambulatory Visit (HOSPITAL_BASED_OUTPATIENT_CLINIC_OR_DEPARTMENT_OTHER): Payer: 59 | Admitting: Oncology

## 2016-03-12 ENCOUNTER — Other Ambulatory Visit: Payer: Self-pay | Admitting: Gynecologic Oncology

## 2016-03-12 ENCOUNTER — Ambulatory Visit
Admission: RE | Admit: 2016-03-12 | Discharge: 2016-03-12 | Disposition: A | Discharge status: Home / Self Care | Payer: 59 | Source: Ambulatory Visit | Attending: Radiation Oncology | Admitting: Radiation Oncology

## 2016-03-12 ENCOUNTER — Encounter: Payer: Self-pay | Admitting: Oncology

## 2016-03-12 ENCOUNTER — Other Ambulatory Visit (HOSPITAL_BASED_OUTPATIENT_CLINIC_OR_DEPARTMENT_OTHER): Payer: 59

## 2016-03-12 VITALS — BP 161/93 | HR 88 | Temp 98.4°F | Resp 18 | Ht 66.0 in | Wt 205.6 lb

## 2016-03-12 DIAGNOSIS — C541 Malignant neoplasm of endometrium: Secondary | ICD-10-CM | POA: Diagnosis not present

## 2016-03-12 DIAGNOSIS — Z720 Tobacco use: Secondary | ICD-10-CM

## 2016-03-12 DIAGNOSIS — R110 Nausea: Secondary | ICD-10-CM

## 2016-03-12 DIAGNOSIS — Z510 Encounter for antineoplastic radiation therapy: Secondary | ICD-10-CM | POA: Diagnosis not present

## 2016-03-12 DIAGNOSIS — T451X5A Adverse effect of antineoplastic and immunosuppressive drugs, initial encounter: Secondary | ICD-10-CM

## 2016-03-12 LAB — CBC WITH DIFFERENTIAL/PLATELET
BASO%: 0.4 % (ref 0.0–2.0)
Basophils Absolute: 0 10*3/uL (ref 0.0–0.1)
EOS%: 8.9 % — ABNORMAL HIGH (ref 0.0–7.0)
Eosinophils Absolute: 0.4 10*3/uL (ref 0.0–0.5)
HCT: 40.4 % (ref 34.8–46.6)
HGB: 13.1 g/dL (ref 11.6–15.9)
LYMPH%: 19.1 % (ref 14.0–49.7)
MCH: 27.4 pg (ref 25.1–34.0)
MCHC: 32.5 g/dL (ref 31.5–36.0)
MCV: 84.4 fL (ref 79.5–101.0)
MONO#: 0.3 10*3/uL (ref 0.1–0.9)
MONO%: 7.4 % (ref 0.0–14.0)
NEUT#: 3 10*3/uL (ref 1.5–6.5)
NEUT%: 64.2 % (ref 38.4–76.8)
PLATELETS: 178 10*3/uL (ref 145–400)
RBC: 4.79 10*6/uL (ref 3.70–5.45)
RDW: 14.1 % (ref 11.2–14.5)
WBC: 4.6 10*3/uL (ref 3.9–10.3)
lymph#: 0.9 10*3/uL (ref 0.9–3.3)

## 2016-03-12 LAB — COMPREHENSIVE METABOLIC PANEL
ALBUMIN: 4.1 g/dL (ref 3.5–5.0)
ALK PHOS: 70 U/L (ref 40–150)
ALT: 22 U/L (ref 0–55)
AST: 13 U/L (ref 5–34)
Anion Gap: 12 mEq/L — ABNORMAL HIGH (ref 3–11)
BUN: 17.3 mg/dL (ref 7.0–26.0)
CALCIUM: 9.7 mg/dL (ref 8.4–10.4)
CO2: 25 mEq/L (ref 22–29)
CREATININE: 0.9 mg/dL (ref 0.6–1.1)
Chloride: 101 mEq/L (ref 98–109)
EGFR: 65 mL/min/{1.73_m2} — ABNORMAL LOW (ref >90)
GLUCOSE: 129 mg/dL (ref 70–140)
Potassium: 4.2 mEq/L (ref 3.5–5.1)
SODIUM: 138 meq/L (ref 136–145)
TOTAL PROTEIN: 7.3 g/dL (ref 6.4–8.3)

## 2016-03-12 LAB — MAGNESIUM: MAGNESIUM: 2.2 mg/dL (ref 1.5–2.5)

## 2016-03-12 MED ORDER — LORAZEPAM 1 MG PO TABS: ORAL_TABLET | ORAL | Status: DC

## 2016-03-12 NOTE — Progress Notes (Signed)
OFFICE PROGRESS NOTE   March 13, 2016   Physicians: Everitt Amber, Lucretia Kern, DO, Gery Pray, Marylynn Pearson, (M.Wert)  INTERVAL HISTORY:   Patient is seen, alone for visit, now having begun adjuvant chemotherapy for IIIC1 high grade endometrial adenocarcinoma with first CDDP 03-05-16. Plan is for CDDP again with completion of radiation ~ 04-05-16, then 4 cycles of carbo taxol with vaginal brachytherapy.  Pathology has found MSI high,  genetics referral by Dr Denman George discussed with patient now, as this information just recently available and not communicated to patient prior to this visit.   Patient has had slight queasiness since chemo, for which she has used ativan in AMs; suggested she try compazine instead which should not make her as drowsy. Bowels were slightly loose x2 in past week, used 1/2 imodium each time with improvement, normal bowel movement today. She has had no GERD, has been drinking fluids well, slight skin irritation at perineum. No bladder symptoms, no bleeding. Some bruising at right upper forearm site of IV access for chemo. Remainder of 10 point Review of Systems negative  ONCOLOGIC HISTORY Patient had postmenopausal spotting 09-2014, with gyn exam and PAP then negative. She had 3 days of spotting in Sept 2016, then slightly heavier spotting in 12-2015. US showed thickened endometrial stripe with normal size uterus, and 1.3 cm dermoid on left ovary. Hysteroscopy D&C 12-27-15 found grade 1 endometrial carcinoma. She was referred to Dr Denman George, with robotic assisted total hysterectomy with BSO and sentinel node evaluation done 01-19-16. Pathology [phone]) found grade 3 endometrial adenocarcinoma (poorly differentiated arising from well differentiated adenocarcinoma), 7.2 cm tumor invading into outer half of myometrium with benign cervix, tubes, ovaries. No definite LVSI. 1 of 8 right pelvic nodes with isolated tumor cell, 1 left pelvic node + in addition to 1 left pelvic node with  isolated tumor cells and 4 left pelvic nodes negative. Patient was DC home within 24 hours, no prophylactic lovenox at DC.  GIven grade 3 pathology, she had CT CAP 02-02-16 on 02-02-16, with no clear distant disease and no obvious residual pelvic disease. Recommendation is for pelvic radiation with CDDP days 1 and 28, then 4 cycles of carboplatin taxol chemotherapy. First CDDP was given 03-05-16.    Objective:  Vital signs in last 24 hours:  BP 161/93 mmHg  Pulse 88  Temp(Src) 98.4 F (36.9 C) (Oral)  Resp 18  Ht 5' 6" (1.676 m)  Wt 205 lb 9.6 oz (93.26 kg)  BMI 33.20 kg/m2  SpO2 99% Weight down 3 lbs. Looks comfortable, very pleasant Alert, oriented and appropriate. Ambulatory without difficulty, easily mobile. No alopecia  HEENT:PERRL, sclerae not icteric. Oral mucosa moist without lesions, posterior pharynx clear.  Neck supple. No JVD.  Lymphatics:no cervical,supraclavicular adenopathy Resp: clear to auscultation bilaterally and normal percussion bilaterally Cardio: regular rate and rhythm. No gallop. GI: soft, nontender, not distended, no mass or organomegaly. Normally active bowel sounds. Surgical incisions not remarkable. Musculoskeletal/ Extremities: without pitting edema, cords, tenderness Neuro: no peripheral neuropathy. Otherwise nonfocal. PSYCH appropriate mood and affect Skin resolving 3 cm ecchymosis right upper forearm without cord or tenderness, otherwise without rash, ecchymosis, petechiae. Minimal erythema and no yeast at left labia and upper inner thigh.   Lab Results:  Results for orders placed or performed in visit on 03/12/16  CBC with Differential  Result Value Ref Range   WBC 4.6 3.9 - 10.3 10e3/uL   NEUT# 3.0 1.5 - 6.5 10e3/uL   HGB 13.1 11.6 - 15.9 g/dL  HCT 40.4 34.8 - 46.6 %   Platelets 178 145 - 400 10e3/uL   MCV 84.4 79.5 - 101.0 fL   MCH 27.4 25.1 - 34.0 pg   MCHC 32.5 31.5 - 36.0 g/dL   RBC 4.79 3.70 - 5.45 10e6/uL   RDW 14.1 11.2 - 14.5 %    lymph# 0.9 0.9 - 3.3 10e3/uL   MONO# 0.3 0.1 - 0.9 10e3/uL   Eosinophils Absolute 0.4 0.0 - 0.5 10e3/uL   Basophils Absolute 0.0 0.0 - 0.1 10e3/uL   NEUT% 64.2 38.4 - 76.8 %   LYMPH% 19.1 14.0 - 49.7 %   MONO% 7.4 0.0 - 14.0 %   EOS% 8.9 (H) 0.0 - 7.0 %   BASO% 0.4 0.0 - 2.0 %  Comprehensive metabolic panel  Result Value Ref Range   Sodium 138 136 - 145 mEq/L   Potassium 4.2 3.5 - 5.1 mEq/L   Chloride 101 98 - 109 mEq/L   CO2 25 22 - 29 mEq/L   Glucose 129 70 - 140 mg/dl   BUN 17.3 7.0 - 26.0 mg/dL   Creatinine 0.9 0.6 - 1.1 mg/dL   Total Bilirubin <0.30 0.20 - 1.20 mg/dL   Alkaline Phosphatase 70 40 - 150 U/L   AST 13 5 - 34 U/L   ALT 22 0 - 55 U/L   Total Protein 7.3 6.4 - 8.3 g/dL   Albumin 4.1 3.5 - 5.0 g/dL   Calcium 9.7 8.4 - 10.4 mg/dL   Anion Gap 12 (H) 3 - 11 mEq/L   EGFR 65 (L) >90 ml/min/1.73 m2  Magnesium  Result Value Ref Range   Magnesium 2.2 1.5 - 2.5 mg/dl     Studies/Results:  No results found.  Medications: I have reviewed the patient's current medications. Recommended trying compazine as noted  DISCUSSION meds as above She will let radiation oncology know about slight irritation at perineum, for appropriate topicals Counts may drop somewhat over next 1-2 weeks, but doubt significant cytopenias with this CDDP. Genetics counseling and testing for high MSI discussed and patient is in agreement with referral as made by Dr Denman George.  Assessment/Plan:  1.IIIC1 poorly differentiated endometrial adenocarcinoma arising in well differentiated endometrial adenocarcinoma: post complete debulking by Dr Denman George 01-19-16. Plan CDDP days 1 and 28 with pelvic radiation then 4 cycles of carboplatin taxol. I will see her ~ 7-24 with lab, due #2 CDDP ~ 7-27. Genetics testing recommended for high MSI. 2.long minimal tobacco abuse: counseled for tobacco cessation, will continue to address 3.overdue mammograms which we will try to address at least after completion of upcoming  treatment. 4.post cholecystectomy and lumbar disc surgery x2 5.no colonoscopy, which will also be addressed as possible 6.environmental allergies 7.benign arrhythmias on Holter 2014  All questions answered and patient knows to call if needed prior to next scheduled visit. Time spent 25 min including >50% counseling and coordination of care.      Gordy Levan, MD   03/13/2016, 8:39 PM

## 2016-03-14 ENCOUNTER — Ambulatory Visit
Admission: RE | Admit: 2016-03-14 | Discharge: 2016-03-14 | Disposition: A | Discharge status: Home / Self Care | Payer: 59 | Source: Ambulatory Visit | Attending: Radiation Oncology | Admitting: Radiation Oncology

## 2016-03-14 DIAGNOSIS — Z510 Encounter for antineoplastic radiation therapy: Secondary | ICD-10-CM | POA: Diagnosis not present

## 2016-03-15 ENCOUNTER — Ambulatory Visit
Admission: RE | Admit: 2016-03-15 | Discharge: 2016-03-15 | Disposition: A | Discharge status: Home / Self Care | Payer: 59 | Source: Ambulatory Visit | Attending: Radiation Oncology | Admitting: Radiation Oncology

## 2016-03-15 DIAGNOSIS — Z510 Encounter for antineoplastic radiation therapy: Secondary | ICD-10-CM | POA: Diagnosis not present

## 2016-03-16 ENCOUNTER — Ambulatory Visit
Admission: RE | Admit: 2016-03-16 | Discharge: 2016-03-16 | Disposition: A | Discharge status: Home / Self Care | Payer: 59 | Source: Ambulatory Visit | Attending: Radiation Oncology | Admitting: Radiation Oncology

## 2016-03-16 ENCOUNTER — Encounter: Payer: Self-pay | Admitting: *Deleted

## 2016-03-16 DIAGNOSIS — Z510 Encounter for antineoplastic radiation therapy: Secondary | ICD-10-CM | POA: Diagnosis not present

## 2016-03-16 NOTE — Progress Notes (Signed)
[facility] Psychosocial Distress Screening Clinical Social Work  Clinical Social Work was referred by distress screening protocol.  The patient scored a 5 on the Psychosocial Distress Thermometer which indicates moderate distress. Clinical Social Worker attempted to contact patient by phone to assess for distress and other psychosocial needs. CSW left voicemail requesting patient return call.  ONCBCN DISTRESS SCREENING 03/12/2016  Screening Type Initial Screening  Distress experienced in past week (1-10) 5  Practical problem type Housing  Family Problem type (No Data)  Emotional problem type (No Data)  Spiritual/Religous concerns type (No Data)  Information Concerns Type (No Data)  Physical Problem type Other (comment)  Physician notified of physical symptoms Yes  Referral to clinical social work Yes    Polo Riley, MSW, LCSW, OSW-C Clinical Social Worker [place] [phone]

## 2016-03-19 ENCOUNTER — Ambulatory Visit: Payer: 59

## 2016-03-19 ENCOUNTER — Ambulatory Visit
Admission: RE | Admit: 2016-03-19 | Discharge: 2016-03-19 | Disposition: A | Discharge status: Home / Self Care | Payer: 59 | Source: Ambulatory Visit | Attending: Radiation Oncology | Admitting: Radiation Oncology

## 2016-03-19 DIAGNOSIS — Z510 Encounter for antineoplastic radiation therapy: Secondary | ICD-10-CM | POA: Diagnosis not present

## 2016-03-20 ENCOUNTER — Encounter: Payer: Self-pay | Admitting: Radiation Oncology

## 2016-03-20 ENCOUNTER — Ambulatory Visit
Admission: RE | Admit: 2016-03-20 | Discharge: 2016-03-20 | Disposition: A | Discharge status: Home / Self Care | Payer: 59 | Source: Ambulatory Visit | Attending: Radiation Oncology | Admitting: Radiation Oncology

## 2016-03-20 VITALS — BP 157/68 | HR 71 | Temp 98.5°F | Ht 66.0 in | Wt 204.0 lb

## 2016-03-20 DIAGNOSIS — Z510 Encounter for antineoplastic radiation therapy: Secondary | ICD-10-CM | POA: Diagnosis not present

## 2016-03-20 DIAGNOSIS — C541 Malignant neoplasm of endometrium: Secondary | ICD-10-CM

## 2016-03-20 NOTE — Progress Notes (Signed)
  Radiation Oncology         [phone] ________________________________  Name: Amy Holmes MRN: [id number]  Date: 03/20/2016  DOB: [birth year]    Weekly Radiation Therapy Management    ICD-9-CM ICD-10-CM   1. Endometrial cancer (HCC) 182.0 C54.1     Current Dose: 23.4 Gy     Planned Dose:  45+ Gy  Narrative . . . . . . . . The patient presents for routine under treatment assessment.                                   Amy Holmes has completed 13 fractions to her pelvis. She denies having pain. She does report having diarrhea on and off and takes imodium as needed. She denies having any vaginal bleeding or discharge. She reports she had some skin irritation on the sides of her vaginal area and used hydrocortisone cream. She said it is gone now. She reports having fatigue. She had chemotherapy on 03/05/16.                                  Set-up films were reviewed.                                 The chart was checked. Physical Findings. . .  height is 5\' 6"  (1.676 m) and weight is 204 lb (92.534 kg). Her oral temperature is 98.5 F (36.9 C). Her blood pressure is 157/68 and her pulse is 71. Her oxygen saturation is 100%. . Weight essentially stable.  No significant changes. Lungs are clear to auscultation bilaterally. Heart has regular rate and rhythm. No palpable cervical, supraclavicular, or axillary adenopathy. Abdomen soft, non-tender, normal bowel sounds.   Impression . . . . . . . The patient is tolerating radiation. Plan . . . . . . . . . . . . Continue treatment as planned. I spoke with the patient about the HDR brachytherapy treatment procedure.   ________________________________   Blair Promise, PhD, MD   This document serves as a record of services personally performed by Gery Pray, MD. It was created on his behalf by Arlyce Harman, a trained medical scribe. The creation of this record is based on the scribe's personal observations and the provider's statements to  them. This document has been checked and approved by the attending provider.

## 2016-03-20 NOTE — Progress Notes (Signed)
Amy Holmes has completed 13 fractions to her pelvis.  She denies having pain.  She does report having diarrhea on and off and takes imodium as needed.  She denies having any vaginal bleeding or discharge.  She reports she had some skin irritation on the sides of her vaginal area and used hydrocortisone cream.  She said it is gone now.  She reports having fatigue.  She had chemotherapy on 03/05/16.   BP 157/68 mmHg  Pulse 71  Temp(Src) 98.5 F (36.9 C) (Oral)  Ht 5\' 6"  (1.676 m)  Wt 204 lb (92.534 kg)  BMI 32.94 kg/m2  SpO2 100%   Wt Readings from Last 3 Encounters:  03/20/16 204 lb (92.534 kg)  03/12/16 205 lb 9.6 oz (93.26 kg)  03/09/16 208 lb 9.6 oz (94.62 kg)

## 2016-03-21 ENCOUNTER — Ambulatory Visit
Admission: RE | Admit: 2016-03-21 | Discharge: 2016-03-21 | Disposition: A | Discharge status: Home / Self Care | Payer: 59 | Source: Ambulatory Visit | Attending: Radiation Oncology | Admitting: Radiation Oncology

## 2016-03-21 DIAGNOSIS — Z510 Encounter for antineoplastic radiation therapy: Secondary | ICD-10-CM | POA: Diagnosis not present

## 2016-03-22 ENCOUNTER — Ambulatory Visit
Admission: RE | Admit: 2016-03-22 | Discharge: 2016-03-22 | Disposition: A | Discharge status: Home / Self Care | Payer: 59 | Source: Ambulatory Visit | Attending: Radiation Oncology | Admitting: Radiation Oncology

## 2016-03-22 DIAGNOSIS — Z510 Encounter for antineoplastic radiation therapy: Secondary | ICD-10-CM | POA: Diagnosis not present

## 2016-03-23 ENCOUNTER — Ambulatory Visit
Admission: RE | Admit: 2016-03-23 | Discharge: 2016-03-23 | Disposition: A | Discharge status: Home / Self Care | Payer: 59 | Source: Ambulatory Visit | Attending: Radiation Oncology | Admitting: Radiation Oncology

## 2016-03-23 DIAGNOSIS — Z510 Encounter for antineoplastic radiation therapy: Secondary | ICD-10-CM | POA: Diagnosis not present

## 2016-03-26 ENCOUNTER — Ambulatory Visit
Admission: RE | Admit: 2016-03-26 | Discharge: 2016-03-26 | Disposition: A | Discharge status: Home / Self Care | Payer: 59 | Source: Ambulatory Visit | Attending: Radiation Oncology | Admitting: Radiation Oncology

## 2016-03-26 DIAGNOSIS — Z510 Encounter for antineoplastic radiation therapy: Secondary | ICD-10-CM | POA: Diagnosis not present

## 2016-03-27 ENCOUNTER — Telehealth: Payer: Self-pay | Admitting: Gynecologic Oncology

## 2016-03-27 ENCOUNTER — Telehealth: Payer: Self-pay | Admitting: Oncology

## 2016-03-27 ENCOUNTER — Ambulatory Visit
Admission: RE | Admit: 2016-03-27 | Discharge: 2016-03-27 | Disposition: A | Discharge status: Home / Self Care | Payer: 59 | Source: Ambulatory Visit | Attending: Radiation Oncology | Admitting: Radiation Oncology

## 2016-03-27 ENCOUNTER — Encounter: Payer: Self-pay | Admitting: Radiation Oncology

## 2016-03-27 VITALS — BP 156/70 | HR 80 | Temp 98.4°F | Ht 66.0 in | Wt 204.0 lb

## 2016-03-27 DIAGNOSIS — Z510 Encounter for antineoplastic radiation therapy: Secondary | ICD-10-CM | POA: Diagnosis not present

## 2016-03-27 DIAGNOSIS — C541 Malignant neoplasm of endometrium: Secondary | ICD-10-CM

## 2016-03-27 NOTE — Telephone Encounter (Signed)
Telephone call to patient to schedule genetic counseling appt. Patient wasn't sure why she needed it. I explained to her that I would have someone from Dr. Mariana Kaufman office to give her a call about. Contacted Kim in [place] [facility] and told her the situation. Amy Holmes will research and they will call the patient back.

## 2016-03-27 NOTE — Progress Notes (Signed)
Amy Holmes has received 18 fractions to her pelvis.  She denies any rectal pain/irritation, but reports that she has diarrhea at least once daily, upon awaking every morning. Taking Imodium AD.   She has intermittent fatigue.

## 2016-03-27 NOTE — Telephone Encounter (Signed)
Explained the reasoning for the genetics referral per Dr. Denman George to the patient.  Patient stating she would like to wait and she would get back to [place] if she wanted to proceed.  She states if she did it, it would be after treatment.  Advised to call for any needs or concerns.

## 2016-03-27 NOTE — Progress Notes (Addendum)
  Radiation Oncology         (336) [phone] ________________________________  Name: Amy Holmes MRN: [id number]  Date: 03/27/2016  DOB: 08-16-1956    Weekly Radiation Therapy Management    ICD-9-CM ICD-10-CM   1. Endometrial cancer (HCC) 182.0 C54.1       Current Dose: 32.4 Gy     Planned Dose:  45+ Gy  Narrative . . . . . . . . The patient presents for routine under treatment assessment.                                   [name] has received 18 fractions to her pelvis. She denies any rectal pain/irritation, but reports that she has diarrhea at least once daily, upon awaking every morning. She can't determine what causes the diarrhea, it will also sometimes happen during the day. Taking Imodium AD. She has intermittent fatigue.                                  Set-up films were reviewed.                                 The chart was checked. Physical Findings. . .  height is 5\' 6"  (1.676 m) and weight is 204 lb (92.534 kg). Her temperature is 98.4 F (36.9 C). Her blood pressure is 156/70 and her pulse is 80. . Weight essentially stable.  No significant changes. The lungs are clear to auscultation. The heart has a regular rhythm and rate. The abdomen is soft and nontender with normal bowel sounds.  Impression . . . . . . . The patient is tolerating radiation. Plan . . . . . . . . . . . . Continue treatment as planned.   ________________________________   Amy Promise, PhD, MD    This document serves as a record of services personally performed by Amy Pray, MD. It was created on his behalf by Lendon Collar, a trained medical scribe. The creation of this record is based on the scribe's personal observations and the provider's statements to them. This document has been checked and approved by the attending provider.

## 2016-03-28 ENCOUNTER — Other Ambulatory Visit: Payer: Self-pay | Admitting: Oncology

## 2016-03-28 ENCOUNTER — Ambulatory Visit
Admission: RE | Admit: 2016-03-28 | Discharge: 2016-03-28 | Disposition: A | Discharge status: Home / Self Care | Payer: 59 | Source: Ambulatory Visit | Attending: Radiation Oncology | Admitting: Radiation Oncology

## 2016-03-28 DIAGNOSIS — Z510 Encounter for antineoplastic radiation therapy: Secondary | ICD-10-CM | POA: Diagnosis not present

## 2016-03-29 ENCOUNTER — Ambulatory Visit
Admission: RE | Admit: 2016-03-29 | Discharge: 2016-03-29 | Disposition: A | Discharge status: Home / Self Care | Payer: 59 | Source: Ambulatory Visit | Attending: Radiation Oncology | Admitting: Radiation Oncology

## 2016-03-29 DIAGNOSIS — Z510 Encounter for antineoplastic radiation therapy: Secondary | ICD-10-CM | POA: Diagnosis not present

## 2016-03-30 ENCOUNTER — Ambulatory Visit
Admission: RE | Admit: 2016-03-30 | Discharge: 2016-03-30 | Disposition: A | Discharge status: Home / Self Care | Payer: 59 | Source: Ambulatory Visit | Attending: Radiation Oncology | Admitting: Radiation Oncology

## 2016-03-30 DIAGNOSIS — Z510 Encounter for antineoplastic radiation therapy: Secondary | ICD-10-CM | POA: Diagnosis not present

## 2016-04-02 ENCOUNTER — Other Ambulatory Visit (HOSPITAL_BASED_OUTPATIENT_CLINIC_OR_DEPARTMENT_OTHER): Payer: 59

## 2016-04-02 ENCOUNTER — Ambulatory Visit (HOSPITAL_BASED_OUTPATIENT_CLINIC_OR_DEPARTMENT_OTHER): Payer: 59 | Admitting: Oncology

## 2016-04-02 ENCOUNTER — Ambulatory Visit: Payer: 59

## 2016-04-02 ENCOUNTER — Telehealth: Payer: Self-pay

## 2016-04-02 ENCOUNTER — Ambulatory Visit
Admission: RE | Admit: 2016-04-02 | Discharge: 2016-04-02 | Disposition: A | Discharge status: Home / Self Care | Payer: 59 | Source: Ambulatory Visit | Attending: Radiation Oncology | Admitting: Radiation Oncology

## 2016-04-02 ENCOUNTER — Encounter: Payer: Self-pay | Admitting: Oncology

## 2016-04-02 VITALS — BP 149/76 | HR 74 | Temp 99.2°F | Resp 18 | Ht 66.0 in | Wt 200.2 lb

## 2016-04-02 DIAGNOSIS — C541 Malignant neoplasm of endometrium: Secondary | ICD-10-CM

## 2016-04-02 DIAGNOSIS — E876 Hypokalemia: Secondary | ICD-10-CM

## 2016-04-02 DIAGNOSIS — R197 Diarrhea, unspecified: Secondary | ICD-10-CM | POA: Diagnosis not present

## 2016-04-02 DIAGNOSIS — Z510 Encounter for antineoplastic radiation therapy: Secondary | ICD-10-CM | POA: Diagnosis not present

## 2016-04-02 LAB — COMPREHENSIVE METABOLIC PANEL
ALT: 25 U/L (ref 0–55)
AST: 18 U/L (ref 5–34)
Albumin: 4.1 g/dL (ref 3.5–5.0)
Alkaline Phosphatase: 59 U/L (ref 40–150)
Anion Gap: 10 mEq/L (ref 3–11)
BUN: 15.9 mg/dL (ref 7.0–26.0)
CALCIUM: 9.4 mg/dL (ref 8.4–10.4)
CHLORIDE: 107 meq/L (ref 98–109)
CO2: 24 meq/L (ref 22–29)
Creatinine: 0.8 mg/dL (ref 0.6–1.1)
EGFR: 79 mL/min/{1.73_m2} — ABNORMAL LOW (ref >90)
GLUCOSE: 117 mg/dL (ref 70–140)
POTASSIUM: 3.3 meq/L — AB (ref 3.5–5.1)
SODIUM: 141 meq/L (ref 136–145)
Total Bilirubin: 0.46 mg/dL (ref 0.20–1.20)
Total Protein: 7.4 g/dL (ref 6.4–8.3)

## 2016-04-02 LAB — CBC WITH DIFFERENTIAL/PLATELET
BASO%: 0.6 % (ref 0.0–2.0)
Basophils Absolute: 0 10*3/uL (ref 0.0–0.1)
EOS%: 21.4 % — AB (ref 0.0–7.0)
Eosinophils Absolute: 0.7 10*3/uL — ABNORMAL HIGH (ref 0.0–0.5)
HEMATOCRIT: 38.5 % (ref 34.8–46.6)
HEMOGLOBIN: 12.7 g/dL (ref 11.6–15.9)
LYMPH#: 0.6 10*3/uL — AB (ref 0.9–3.3)
LYMPH%: 16.1 % (ref 14.0–49.7)
MCH: 27.8 pg (ref 25.1–34.0)
MCHC: 33 g/dL (ref 31.5–36.0)
MCV: 84.2 fL (ref 79.5–101.0)
MONO#: 0.3 10*3/uL (ref 0.1–0.9)
MONO%: 9.5 % (ref 0.0–14.0)
NEUT%: 52.4 % (ref 38.4–76.8)
NEUTROS ABS: 1.8 10*3/uL (ref 1.5–6.5)
PLATELETS: 120 10*3/uL — AB (ref 145–400)
RBC: 4.57 10*6/uL (ref 3.70–5.45)
RDW: 14.8 % — AB (ref 11.2–14.5)
WBC: 3.5 10*3/uL — ABNORMAL LOW (ref 3.9–10.3)

## 2016-04-02 LAB — MAGNESIUM: MAGNESIUM: 2 mg/dL (ref 1.5–2.5)

## 2016-04-02 MED ORDER — POTASSIUM CHLORIDE ER 10 MEQ PO TBCR: 20.0000 meq | EXTENDED_RELEASE_TABLET | Freq: Every day | ORAL | 0 refills | Status: DC

## 2016-04-02 NOTE — Progress Notes (Signed)
OFFICE PROGRESS NOTE   April 02, 2016   Physicians: Everitt Amber, Lucretia Kern, DO, Gery Pray, Marylynn Pearson, (M.Wert)  INTERVAL HISTORY:  Patient is seen, alone for visit, as she continues adjuvant treatment for IIIC1 high grade endometrial adenocarcinoma. She had first CDDP on 03-05-16 with start of IMRT, and will have second CDDP on 04-05-16 as pelvic radiation completes. Plan is to continue treatment after this with 4 cycles of taxol carboplatin and vaginal brachytherapy.  Primary problem recently has been radiation related diarrhea, which is not controlled with 2 imodium daily. Patient is instructed now that she can increase imodium up to 8 tablets / 24 hours, which she had not understood previously. She had 2 large watery bowel movements yesterday and has had 2 by time of this visit today. Bowels move after any po intake, such that she has been afraid to eat. She has some taste alterations including for liquids, but will increase oral hydration for the diarrhea and prior to cycle 2 CDDP on 7-27.  She denies nausea or vomiting, fever, bleeding, abdominal or pelvic pain, SOB or other respiratory symptoms, LE swelling, muscle cramps. She feels bladder is a little irritated now, denies skin irritation from radiation or diarrhea.  Remainder of 10 point Review of Systems negative.    No central catheter MSI high, needs genetics counseling. Per patient, she was contacted by genetics last week but has not yet returned the call to schedule, which she plans to do.  ONCOLOGIC HISTORY Patient had postmenopausal spotting 09-2014, with gyn exam and PAP then negative. She had 3 days of spotting in Sept 2016, then slightly heavier spotting in 12-2015. US showed thickened endometrial stripe with normal size uterus, and 1.3 cm dermoid on left ovary. Hysteroscopy D&C 12-27-15 found grade 1 endometrial carcinoma. She was referred to Dr Denman George, with robotic assisted total hysterectomy with BSO and sentinel node  evaluation done 01-19-16. Pathology [phone]) found grade 3 endometrial adenocarcinoma (poorly differentiated arising from well differentiated adenocarcinoma), 7.2 cm tumor invading into outer half of myometrium with benign cervix, tubes, ovaries. No definite LVSI. 1 of 8 right pelvic nodes with isolated tumor cell, 1 left pelvic node + in addition to 1 left pelvic node with isolated tumor cells and 4 left pelvic nodes negative. Patient was DC home within 24 hours, no prophylactic lovenox at DC.  GIven grade 3 pathology, she had CT CAP 02-02-16 on 02-02-16, with no clear distant disease and no obvious residual pelvic disease. Recommendation is for pelvic radiation with CDDP days 1 and 28, then 4 cycles of carboplatin taxol chemotherapy. First CDDP was given 03-05-16.     Objective:  Vital signs in last 24 hours:  BP (!) 149/76 (BP Location: Right Arm, Patient Position: Sitting)   Pulse 74   Temp 99.2 F (37.3 C) (Oral)   Resp 18   Ht 5' 6"  (1.676 m)   Wt 200 lb 3.2 oz (90.8 kg)   SpO2 100%   BMI 32.31 kg/m   Weight down 5 lbs from 03-13-16  Alert, oriented and appropriate. Ambulatory without assistance difficulty.  No alopecia  HEENT:PERRL, sclerae not icteric. Oral mucosa moist without lesions, posterior pharynx clear.  Neck supple. No JVD.  Lymphatics:no cervical,suraclavicular, axillary or inguinal adenopathy Resp: clear to auscultation bilaterally and normal percussion bilaterally Cardio: regular rate and rhythm. No gallop. GI: soft, nontender, not distended, no mass or organomegaly. Normally active bowel sounds. Surgical incision not remarkable. Musculoskeletal/ Extremities: without pitting edema, cords, tenderness Neuro: no  peripheral neuropathy. Otherwise nonfocal Skin without rash, ecchymosis, petechiae Breasts: without dominant mass, skin or nipple findings. Axillae benign. Portacath-without erythema or tenderness  Lab Results:  Results for orders placed or performed in  visit on 04/02/16  CBC with Differential  Result Value Ref Range   WBC 3.5 (L) 3.9 - 10.3 10e3/uL   NEUT# 1.8 1.5 - 6.5 10e3/uL   HGB 12.7 11.6 - 15.9 g/dL   HCT 38.5 34.8 - 46.6 %   Platelets 120 (L) 145 - 400 10e3/uL   MCV 84.2 79.5 - 101.0 fL   MCH 27.8 25.1 - 34.0 pg   MCHC 33.0 31.5 - 36.0 g/dL   RBC 4.57 3.70 - 5.45 10e6/uL   RDW 14.8 (H) 11.2 - 14.5 %   lymph# 0.6 (L) 0.9 - 3.3 10e3/uL   MONO# 0.3 0.1 - 0.9 10e3/uL   Eosinophils Absolute 0.7 (H) 0.0 - 0.5 10e3/uL   Basophils Absolute 0.0 0.0 - 0.1 10e3/uL   NEUT% 52.4 38.4 - 76.8 %   LYMPH% 16.1 14.0 - 49.7 %   MONO% 9.5 0.0 - 14.0 %   EOS% 21.4 (H) 0.0 - 7.0 %   BASO% 0.6 0.0 - 2.0 %  Comprehensive metabolic panel  Result Value Ref Range   Sodium 141 136 - 145 mEq/L   Potassium 3.3 (L) 3.5 - 5.1 mEq/L   Chloride 107 98 - 109 mEq/L   CO2 24 22 - 29 mEq/L   Glucose 117 70 - 140 mg/dl   BUN 15.9 7.0 - 26.0 mg/dL   Creatinine 0.8 0.6 - 1.1 mg/dL   Total Bilirubin 0.46 0.20 - 1.20 mg/dL   Alkaline Phosphatase 59 40 - 150 U/L   AST 18 5 - 34 U/L   ALT 25 0 - 55 U/L   Total Protein 7.4 6.4 - 8.3 g/dL   Albumin 4.1 3.5 - 5.0 g/dL   Calcium 9.4 8.4 - 10.4 mg/dL   Anion Gap 10 3 - 11 mEq/L   EGFR 79 (L) >90 ml/min/1.73 m2  Magnesium  Result Value Ref Range   Magnesium 2.0 1.5 - 2.5 mg/dl   CBC reviewed with patient at time of visit.  Labs ok today for treatment 04-05-16, tho will repeat BMET in follow up of potassium.  Studies/Results:  No results found.  Medications: I have reviewed the patient's current medications. Will add potassium 10 mEq tablets 2 daily and repeat BMET day of chemo 04-05-16. Increase imodium up to max 8 tablets in 24 hrs to control radiation diarrhea.   DISCUSSION Imodium as above; will be in touch with her by phone re K.  Increase hydration with diarrhea. Reviewed oral prehydration for CDDP. Discussed taste alterations with chemo. Genetics counseling as above.  Will review carbo taxol  with visit after cycle 2 CDDP  Assessment/Plan:  1.IIIC1 poorly differentiated endometrial adenocarcinoma arising in well differentiated endometrial adenocarcinoma: post complete debulking by Dr Denman George 01-19-16.  CDDP "day 28" with pelvic radiation on 04-05-16, then 4 cycles of carboplatin taxol. I will see her ~ 8-14 with lab. Genetics testing recommended for high MSI. 2.apparent radiation diarrhea, with mild hypokalemia. Increase imodium and supplement K, repeat BMET on 7-27. Magnesium ok. 3.long minimal tobacco abuse: counseled for tobacco cessation, will continue to address 4.overdue mammograms which we will try to address at least after completion of treatment. 5.post cholecystectomy and lumbar disc surgery x2 6.no colonoscopy, which will also be addressed as possible 7.environmental allergies 8.benign arrhythmias on Holter 2014   All  questions answered and patient is in agreement with recommendations and plan.  Chemo orders confirmed, ok to treatt 04-05-16 by CBC and chemistries today (repeat BMET 7-27 for K). Route PCP, cc Drs Denman George and Sondra Come. Time spent 25 min including >50% counseling and coordination of care.    Evlyn Clines, MD   04/02/2016, 8:44 PM

## 2016-04-02 NOTE — Telephone Encounter (Signed)
-----   Message from Gordy Levan, MD sent at 04/02/2016  3:43 PM EDT ----- Please let her know K a little low from the diarrhea.  In addition to more imodium and more gatorade/ sports drinks/ juice, please give KCl 10 mEq 2 tablets daily #30. We will repeat BMET on 7-27 and need to let her know if any dose change then.

## 2016-04-02 NOTE — Telephone Encounter (Signed)
LM for Ms Cahalan stating that her potassium is a little low as noted below by Dr Marko Plume. Requested that she call Dr. Mariana Kaufman nurse confirming receipt of this message.

## 2016-04-03 ENCOUNTER — Ambulatory Visit
Admission: RE | Admit: 2016-04-03 | Discharge: 2016-04-03 | Disposition: A | Discharge status: Home / Self Care | Payer: 59 | Source: Ambulatory Visit | Attending: Radiation Oncology | Admitting: Radiation Oncology

## 2016-04-03 ENCOUNTER — Encounter: Payer: Self-pay | Admitting: Radiation Oncology

## 2016-04-03 VITALS — BP 160/84 | HR 72 | Temp 98.5°F | Ht 66.0 in | Wt 202.9 lb

## 2016-04-03 DIAGNOSIS — Z510 Encounter for antineoplastic radiation therapy: Secondary | ICD-10-CM | POA: Diagnosis not present

## 2016-04-03 DIAGNOSIS — C541 Malignant neoplasm of endometrium: Secondary | ICD-10-CM

## 2016-04-03 NOTE — Progress Notes (Addendum)
Miasia Headman has completed 23 fractions to her pelvis.  She denies having pain.  She reports occasionally dysuria.  She reports having diarrhea 3 times a day.  She is taking Imodium 4 tablets daily.  She denies having hematuria, vaginal or rectal bleeding. She denies having any skin irritation.  She reports her energy level is normal.  She will start chemotherapy this Thursday.    BP (!) 160/84 (BP Location: Right Arm, Patient Position: Sitting)   Pulse 72   Temp 98.5 F (36.9 C) (Oral)   Ht 5\' 6"  (1.676 m)   Wt 202 lb 14.4 oz (92 kg)   SpO2 100%   BMI 32.75 kg/m    Wt Readings from Last 3 Encounters:  04/03/16 202 lb 14.4 oz (92 kg)  04/02/16 200 lb 3.2 oz (90.8 kg)  03/27/16 204 lb (92.5 kg)

## 2016-04-03 NOTE — Progress Notes (Signed)
  Radiation Oncology         (336) [phone] ________________________________  Name: Amy Holmes MRN: [id number]  Date: 04/03/2016  DOB: 1956/04/03    Weekly Radiation Therapy Management    ICD-9-CM ICD-10-CM   1. Endometrial cancer (HCC) 182.0 C54.1       Current Dose: 41.4 Gy     Planned Dose:  45+ Gy  Narrative . . . . . . . . The patient presents for routine under treatment assessment.                                   Amy Holmes has completed 23 fractions to her pelvis.  She denies having pain.  She reports occasional dysuria.  She denies nausea. She reports having diarrhea 3 times a day.  She is taking Imodium 4 tablets daily.  She denies having hematuria, vaginal, or rectal bleeding. She denies having any skin irritation.  She reports her energy level is normal.  She will start chemotherapy this Thursday.                                 Set-up films were reviewed.                                 The chart was checked. Physical Findings. . .  height is 5\' 6"  (1.676 m) and weight is 202 lb 14.4 oz (92 kg). Her oral temperature is 98.5 F (36.9 C). Her blood pressure is 160/84 (abnormal) and her pulse is 72. Her oxygen saturation is 100%. . Weight essentially stable.  No significant changes. The lungs are clear to auscultation. The heart has a regular rhythm and rate. The abdomen is soft and nontender with normal bowel sounds.  Impression . . . . . . . The patient is tolerating radiation. Plan . . . . . . . . . . . . Continue treatment as planned.   ________________________________   Blair Promise, PhD, MD  This document serves as a record of services personally performed by Gery Pray, MD. It was created on his behalf by Darcus Austin, a trained medical scribe. The creation of this record is based on the scribe's personal observations and the provider's statements to them. This document has been checked and approved by the attending provider.

## 2016-04-03 NOTE — Telephone Encounter (Signed)
Spoke with Ms. Boutilier and Catha Gosselin did receive the message regarding the potassium level and prescription.  She will p/u prescription today after work.

## 2016-04-04 ENCOUNTER — Ambulatory Visit
Admission: RE | Admit: 2016-04-04 | Discharge: 2016-04-04 | Disposition: A | Discharge status: Home / Self Care | Payer: 59 | Source: Ambulatory Visit | Attending: Radiation Oncology | Admitting: Radiation Oncology

## 2016-04-04 DIAGNOSIS — Z510 Encounter for antineoplastic radiation therapy: Secondary | ICD-10-CM | POA: Diagnosis not present

## 2016-04-05 ENCOUNTER — Other Ambulatory Visit (HOSPITAL_BASED_OUTPATIENT_CLINIC_OR_DEPARTMENT_OTHER): Payer: 59

## 2016-04-05 ENCOUNTER — Ambulatory Visit
Admission: RE | Admit: 2016-04-05 | Discharge: 2016-04-05 | Disposition: A | Discharge status: Home / Self Care | Payer: 59 | Source: Ambulatory Visit | Attending: Radiation Oncology | Admitting: Radiation Oncology

## 2016-04-05 ENCOUNTER — Ambulatory Visit (HOSPITAL_BASED_OUTPATIENT_CLINIC_OR_DEPARTMENT_OTHER): Payer: 59

## 2016-04-05 ENCOUNTER — Telehealth: Payer: Self-pay

## 2016-04-05 DIAGNOSIS — Z5111 Encounter for antineoplastic chemotherapy: Secondary | ICD-10-CM | POA: Diagnosis not present

## 2016-04-05 DIAGNOSIS — Z510 Encounter for antineoplastic radiation therapy: Secondary | ICD-10-CM | POA: Diagnosis not present

## 2016-04-05 DIAGNOSIS — C541 Malignant neoplasm of endometrium: Secondary | ICD-10-CM

## 2016-04-05 DIAGNOSIS — E876 Hypokalemia: Secondary | ICD-10-CM

## 2016-04-05 LAB — BASIC METABOLIC PANEL
ANION GAP: 11 meq/L (ref 3–11)
BUN: 9.9 mg/dL (ref 7.0–26.0)
CALCIUM: 9.4 mg/dL (ref 8.4–10.4)
CO2: 22 meq/L (ref 22–29)
CREATININE: 0.8 mg/dL (ref 0.6–1.1)
Chloride: 105 mEq/L (ref 98–109)
EGFR: 82 mL/min/{1.73_m2} — ABNORMAL LOW (ref >90)
GLUCOSE: 116 mg/dL (ref 70–140)
Potassium: 4.3 mEq/L (ref 3.5–5.1)
Sodium: 138 mEq/L (ref 136–145)

## 2016-04-05 MED ORDER — POTASSIUM CHLORIDE 2 MEQ/ML IV SOLN
Freq: Once | INTRAVENOUS | Status: AC
  Administered 2016-04-05: 12:00:00 via INTRAVENOUS
  Filled 2016-04-05: qty 10

## 2016-04-05 MED ORDER — SODIUM CHLORIDE 0.9 % IV SOLN
Freq: Once | INTRAVENOUS | Status: AC
  Administered 2016-04-05: 11:00:00 via INTRAVENOUS

## 2016-04-05 MED ORDER — PALONOSETRON HCL INJECTION 0.25 MG/5ML
INTRAVENOUS | Status: AC
  Filled 2016-04-05: qty 5

## 2016-04-05 MED ORDER — PALONOSETRON HCL INJECTION 0.25 MG/5ML
0.2500 mg | Freq: Once | INTRAVENOUS | Status: AC
  Administered 2016-04-05: 0.25 mg via INTRAVENOUS

## 2016-04-05 MED ORDER — SODIUM CHLORIDE 0.9 % IV SOLN
Freq: Once | INTRAVENOUS | Status: AC
  Administered 2016-04-05: 14:00:00 via INTRAVENOUS
  Filled 2016-04-05: qty 5

## 2016-04-05 MED ORDER — SODIUM CHLORIDE 0.9 % IV SOLN
50.0000 mg/m2 | Freq: Once | INTRAVENOUS | Status: AC
  Administered 2016-04-05: 105 mg via INTRAVENOUS
  Filled 2016-04-05: qty 105

## 2016-04-05 NOTE — Patient Instructions (Signed)
[facility] Discharge Instructions for Patients Receiving Chemotherapy  Today you received the following chemotherapy agents Cisplatin  To help prevent nausea and vomiting after your treatment, we encourage you to take your nausea medication   If you develop nausea and vomiting that is not controlled by your nausea medication, call the clinic.   BELOW ARE SYMPTOMS THAT SHOULD BE REPORTED IMMEDIATELY:  *FEVER GREATER THAN 100.5 F  *CHILLS WITH OR WITHOUT FEVER  NAUSEA AND VOMITING THAT IS NOT CONTROLLED WITH YOUR NAUSEA MEDICATION  *UNUSUAL SHORTNESS OF BREATH  *UNUSUAL BRUISING OR BLEEDING  TENDERNESS IN MOUTH AND THROAT WITH OR WITHOUT PRESENCE OF ULCERS  *URINARY PROBLEMS  *BOWEL PROBLEMS  UNUSUAL RASH Items with * indicate a potential emergency and should be followed up as soon as possible.  Feel free to call the clinic you have any questions or concerns. The clinic phone number is [phone].  Please show the CHEMO ALERT CARD at check-in to the Emergency Department and triage nurse.   

## 2016-04-05 NOTE — Telephone Encounter (Signed)
S/w pt to decrease her potassium to 1 10 meq tab daily per Dr Marko Plume

## 2016-04-10 ENCOUNTER — Encounter: Payer: Self-pay | Admitting: Nurse Practitioner

## 2016-04-10 ENCOUNTER — Other Ambulatory Visit: Payer: Self-pay | Admitting: *Deleted

## 2016-04-10 ENCOUNTER — Telehealth: Payer: Self-pay | Admitting: Nurse Practitioner

## 2016-04-10 ENCOUNTER — Ambulatory Visit (HOSPITAL_BASED_OUTPATIENT_CLINIC_OR_DEPARTMENT_OTHER): Payer: 59 | Admitting: Nurse Practitioner

## 2016-04-10 ENCOUNTER — Ambulatory Visit (HOSPITAL_BASED_OUTPATIENT_CLINIC_OR_DEPARTMENT_OTHER): Payer: 59

## 2016-04-10 ENCOUNTER — Telehealth: Payer: Self-pay | Admitting: *Deleted

## 2016-04-10 VITALS — BP 158/69 | HR 80 | Temp 98.5°F | Resp 18 | Ht 66.0 in | Wt 197.5 lb

## 2016-04-10 DIAGNOSIS — C541 Malignant neoplasm of endometrium: Secondary | ICD-10-CM

## 2016-04-10 DIAGNOSIS — E860 Dehydration: Secondary | ICD-10-CM

## 2016-04-10 DIAGNOSIS — R197 Diarrhea, unspecified: Secondary | ICD-10-CM

## 2016-04-10 LAB — MAGNESIUM: MAGNESIUM: 2.1 mg/dL (ref 1.5–2.5)

## 2016-04-10 LAB — COMPREHENSIVE METABOLIC PANEL
ALBUMIN: 4.1 g/dL (ref 3.5–5.0)
ALK PHOS: 59 U/L (ref 40–150)
ALT: 23 U/L (ref 0–55)
AST: 14 U/L (ref 5–34)
Anion Gap: 9 mEq/L (ref 3–11)
BILIRUBIN TOTAL: 0.56 mg/dL (ref 0.20–1.20)
BUN: 12.2 mg/dL (ref 7.0–26.0)
CALCIUM: 9.4 mg/dL (ref 8.4–10.4)
CO2: 25 mEq/L (ref 22–29)
Chloride: 101 mEq/L (ref 98–109)
Creatinine: 0.8 mg/dL (ref 0.6–1.1)
EGFR: 79 mL/min/{1.73_m2} — AB (ref >90)
Glucose: 129 mg/dl (ref 70–140)
POTASSIUM: 3.9 meq/L (ref 3.5–5.1)
Sodium: 135 mEq/L — ABNORMAL LOW (ref 136–145)
Total Protein: 7.3 g/dL (ref 6.4–8.3)

## 2016-04-10 LAB — CBC WITH DIFFERENTIAL/PLATELET
BASO%: 0.2 % (ref 0.0–2.0)
BASOS ABS: 0 10*3/uL (ref 0.0–0.1)
EOS ABS: 0.6 10*3/uL — AB (ref 0.0–0.5)
EOS%: 15 % — ABNORMAL HIGH (ref 0.0–7.0)
HEMATOCRIT: 39.1 % (ref 34.8–46.6)
HEMOGLOBIN: 12.9 g/dL (ref 11.6–15.9)
LYMPH#: 0.5 10*3/uL — AB (ref 0.9–3.3)
LYMPH%: 11.6 % — ABNORMAL LOW (ref 14.0–49.7)
MCH: 28 pg (ref 25.1–34.0)
MCHC: 33 g/dL (ref 31.5–36.0)
MCV: 84.7 fL (ref 79.5–101.0)
MONO#: 0.4 10*3/uL (ref 0.1–0.9)
MONO%: 9.6 % (ref 0.0–14.0)
NEUT#: 2.5 10*3/uL (ref 1.5–6.5)
NEUT%: 63.6 % (ref 38.4–76.8)
Platelets: 174 10*3/uL (ref 145–400)
RBC: 4.61 10*6/uL (ref 3.70–5.45)
RDW: 15.3 % — AB (ref 11.2–14.5)
WBC: 3.9 10*3/uL (ref 3.9–10.3)

## 2016-04-10 MED ORDER — SODIUM CHLORIDE 0.9 % IV SOLN
1500.0000 mL | Freq: Once | INTRAVENOUS | Status: AC
  Administered 2016-04-10: 1500 mL via INTRAVENOUS

## 2016-04-10 MED ORDER — DIPHENOXYLATE-ATROPINE 2.5-0.025 MG PO TABS
ORAL_TABLET | ORAL | Status: AC
  Filled 2016-04-10: qty 2

## 2016-04-10 MED ORDER — DIPHENOXYLATE-ATROPINE 2.5-0.025 MG PO TABS: 2.0000 | ORAL_TABLET | Freq: Four times a day (QID) | ORAL | 0 refills | Status: DC | PRN

## 2016-04-10 MED ORDER — DIPHENOXYLATE-ATROPINE 2.5-0.025 MG PO TABS
2.0000 | ORAL_TABLET | Freq: Once | ORAL | Status: AC
  Administered 2016-04-10: 2 via ORAL

## 2016-04-10 NOTE — Assessment & Plan Note (Signed)
Patient received cycle 2 of her Taxol/carboplatin chemotherapy regimen on 04/05/2016.  She also completed her final radiation treatment to the pelvis region on 04/05/2016 as well.  The plan is for the patient to continue with chemotherapy; and to next undergo vaginal brachytherapy as well.  Patient is now scheduled for labs only on 05/09/2016.  Will review all with Dr. Marko Plume; and confirm with patient is to be seen next to the [place].

## 2016-04-10 NOTE — Telephone Encounter (Signed)
Amy Holmes called the office to report having diarrhea  Since ~2 am Sat 04-07-16. She received CDDP on 04-05-16 and completed external radiation 04-05-16. She is using the imodium tabs up to 8 in a 24 hr period.  It is not helping. She has only taken in ~32oz of fluid a day since Saturday. [name] agreed to come to [facility] to see Selena Lesser Symptom Management NP. She is to arrive ~ 1200 for lab appointment at 1215 then visit with Cyndee and fluids to follow ~1300 in infusion.

## 2016-04-10 NOTE — Assessment & Plan Note (Addendum)
Patient developed significant diarrhea over this past weekend.  She has been taking up to 8 Imodium per day; with minimal effectiveness.  She feels dehydrated today.  Patient was given Lomotil 2 tablets while at the [place].  She will receive IV fluid rehydration while at the [place].  Carefully reviewed the need for a clear liquid diet until patient is able to tolerate a bland diet until diarrhea clears.  Patient was also given a prescription for Lomotil to alternate with the Imodium as well.  Patient was advised to call/return or go directly to the emergency department for any worsening symptoms whatsoever.  Note: Patient attempted to obtain a stool to rule out C. difficile today; but was unable to give a stool sample.  Patient will be sent home with a stool kit to rule out C. difficile.

## 2016-04-10 NOTE — Patient Instructions (Addendum)
Dehydration, Adult Dehydration is a condition in which you do not have enough fluid or water in your body. It happens when you take in less fluid than you lose. Vital organs such as the kidneys, brain, and heart cannot function without a proper amount of fluids. Any loss of fluids from the body can cause dehydration.  Dehydration can range from mild to severe. This condition should be treated right away to help prevent it from becoming severe. CAUSES  This condition may be caused by:  Vomiting.  Diarrhea.  Excessive sweating, such as when exercising in hot or humid weather.  Not drinking enough fluid during strenuous exercise or during an illness.  Excessive urine output.  Fever.  Certain medicines. RISK FACTORS This condition is more likely to develop in:  People who are taking certain medicines that cause the body to lose excess fluid (diuretics).   People who have a chronic illness, such as diabetes, that may increase urination.  Older adults.   People who live at high altitudes.   People who participate in endurance sports.  SYMPTOMS  Mild Dehydration  Thirst.  Dry lips.  Slightly dry mouth.  Dry, warm skin. Moderate Dehydration  Very dry mouth.   Muscle cramps.   Dark urine and decreased urine production.   Decreased tear production.   Headache.   Light-headedness, especially when you stand up from a sitting position.  Severe Dehydration  Changes in skin.   Cold and clammy skin.   Skin does not spring back quickly when lightly pinched and released.   Changes in body fluids.   Extreme thirst.   No tears.   Not able to sweat when body temperature is high, such as in hot weather.   Minimal urine production.   Changes in vital signs.   Rapid, weak pulse (more than 100 beats per minute when you are sitting still).   Rapid breathing.   Low blood pressure.   Other changes.   Sunken eyes.   Cold hands and feet.    Confusion.  Lethargy and difficulty being awakened.  Fainting (syncope).   Short-term weight loss.   Unconsciousness. DIAGNOSIS  This condition may be diagnosed based on your symptoms. You may also have tests to determine how severe your dehydration is. These tests may include:   Urine tests.   Blood tests.  TREATMENT  Treatment for this condition depends on the severity. Mild or moderate dehydration can often be treated at home. Treatment should be started right away. Do not wait until dehydration becomes severe. Severe dehydration needs to be treated at the hospital. Treatment for Mild Dehydration  Drinking plenty of water to replace the fluid you have lost.   Replacing minerals in your blood (electrolytes) that you may have lost.  Treatment for Moderate Dehydration  Consuming oral rehydration solution (ORS). Treatment for Severe Dehydration  Receiving fluid through an IV tube.   Receiving electrolyte solution through a feeding tube that is passed through your nose and into your stomach (nasogastric tube or NG tube).  Correcting any abnormalities in electrolytes. HOME CARE INSTRUCTIONS   Drink enough fluid to keep your urine clear or pale yellow.   Drink water or fluid slowly by taking small sips. You can also try sucking on ice cubes.  Have food or beverages that contain electrolytes. Examples include bananas and sports drinks.  Take over-the-counter and prescription medicines only as told by your health care provider.   Prepare ORS according to the manufacturer's instructions. Take sips   of ORS every 5 minutes until your urine returns to normal.  If you have vomiting or diarrhea, continue to try to drink water, ORS, or both.   If you have diarrhea, avoid:   Beverages that contain caffeine.   Fruit juice.   Milk.   Carbonated soft drinks.  Do not take salt tablets. This can lead to the condition of having too much sodium in your body  (hypernatremia).  SEEK MEDICAL CARE IF:  You cannot eat or drink without vomiting.  You have had moderate diarrhea during a period of more than 24 hours.  You have a fever. SEEK IMMEDIATE MEDICAL CARE IF:   You have extreme thirst.  You have severe diarrhea.  You have not urinated in 6-8 hours, or you have urinated only a small amount of very dark urine.  You have shriveled skin.  You are dizzy, confused, or both.   This information is not intended to replace advice given to you by your health care provider. Make sure you discuss any questions you have with your health care provider.   Document Released: 08/27/2005 Document Revised: 05/18/2015 Document Reviewed: 01/12/2015 Elsevier Interactive Patient Education 2016 [place] Liquid Diet A clear liquid diet is a short-term diet that is prescribed to provide the necessary fluid and basic energy you need when you can have nothing else. The clear liquid diet consists of liquids or solids that will become liquid at room temperature. You should be able to see through the liquid. There are many reasons that you may be restricted to clear liquids, such as:  When you have a sudden-onset (acute) condition that occurs before or after surgery.  To help your body slowly get adjusted to food again after a long period when you were unable to have food.  Replacement of fluids when you have a diarrheal disease.  When you are going to have certain exams, such as a colonoscopy, in which instruments are inserted inside your body to look at parts of your digestive system. WHAT CAN I HAVE? A clear liquid diet does not provide all the nutrients you need. It is important to choose a variety of the following items to get as many nutrients as possible:  Vegetable juices that do not have pulp.  Fruit juices and fruit drinks that do not have pulp.  Coffee (regular or decaffeinated), tea, or soda at the discretion of your health care  provider.  Clear bouillon, broth, or strained broth-based soups.  High-protein and flavored gelatins.  Sugar or honey.  Ices or frozen ice pops that do not contain milk. If you are not sure whether you can have certain items, you should ask your health care provider. You may also ask your health care provider if there are any other clear liquid options.   This information is not intended to replace advice given to you by your health care provider. Make sure you discuss any questions you have with your health care provider.   Document Released: 08/27/2005 Document Revised: 09/01/2013 Document Reviewed: 07/24/2013 Elsevier Interactive Patient Education 2016 [place].  Diarrhea Diarrhea is frequent loose and watery bowel movements. It can cause you to feel weak and dehydrated. Dehydration can cause you to become tired and thirsty, have a dry mouth, and have decreased urination that often is dark yellow. Diarrhea is a sign of another problem, most often an infection that will not last long. In most cases, diarrhea typically lasts 2-3 days. However, it can last longer if  it is a sign of something more serious. It is important to treat your diarrhea as directed by your caregiver to lessen or prevent future episodes of diarrhea. CAUSES  Some common causes include:  Gastrointestinal infections caused by viruses, bacteria, or parasites.  Food poisoning or food allergies.  Certain medicines, such as antibiotics, chemotherapy, and laxatives.  Artificial sweeteners and fructose.  Digestive disorders. HOME CARE INSTRUCTIONS  Ensure adequate fluid intake (hydration): Have 1 cup (8 oz) of fluid for each diarrhea episode. Avoid fluids that contain simple sugars or sports drinks, fruit juices, whole milk products, and sodas. Your urine should be clear or pale yellow if you are drinking enough fluids. Hydrate with an oral rehydration solution that you can purchase at pharmacies, retail stores, and  online. You can prepare an oral rehydration solution at home by mixing the following ingredients together:   - tsp table salt.   tsp baking soda.   tsp salt substitute containing potassium chloride.  1  tablespoons sugar.  1 L (34 oz) of water.  Certain foods and beverages may increase the speed at which food moves through the gastrointestinal (GI) tract. These foods and beverages should be avoided and include:  Caffeinated and alcoholic beverages.  High-fiber foods, such as raw fruits and vegetables, nuts, seeds, and whole grain breads and cereals.  Foods and beverages sweetened with sugar alcohols, such as xylitol, sorbitol, and mannitol.  Some foods may be well tolerated and may help thicken stool including:  Starchy foods, such as rice, toast, pasta, low-sugar cereal, oatmeal, grits, baked potatoes, crackers, and bagels.  Bananas.  Applesauce.  Add probiotic-rich foods to help increase healthy bacteria in the GI tract, such as yogurt and fermented milk products.  Wash your hands well after each diarrhea episode.  Only take over-the-counter or prescription medicines as directed by your caregiver.  Take a warm bath to relieve any burning or pain from frequent diarrhea episodes. SEEK IMMEDIATE MEDICAL CARE IF:   You are unable to keep fluids down.  You have persistent vomiting.  You have blood in your stool, or your stools are black and tarry.  You do not urinate in 6-8 hours, or there is only a small amount of very dark urine.  You have abdominal pain that increases or localizes.  You have weakness, dizziness, confusion, or light-headedness.  You have a severe headache.  Your diarrhea gets worse or does not get better.  You have a fever or persistent symptoms for more than 2-3 days.  You have a fever and your symptoms suddenly get worse. MAKE SURE YOU:   Understand these instructions.  Will watch your condition.  Will get help right away if you are not  doing well or get worse.   This information is not intended to replace advice given to you by your health care provider. Make sure you discuss any questions you have with your health care provider.   Document Released: 08/17/2002 Document Revised: 09/17/2014 Document Reviewed: 05/04/2012 Elsevier Interactive Patient Education [employer].

## 2016-04-10 NOTE — Assessment & Plan Note (Signed)
Patient developed significant diarrhea over this past weekend.  She has been taking up to 8 Imodium per day; with minimal effectiveness.  She feels dehydrated today.  Patient was given Lomotil 2 tablets while at the [facility].  She will receive IV fluid rehydration while at the [facility].  Carefully reviewed the need for a clear liquid diet until patient is able to tolerate a bland diet until diarrhea clears.  Patient was also given a prescription for Lomotil to alternate with the Imodium as well.  Patient was advised to call/return or go directly to the emergency department for any worsening symptoms whatsoever.

## 2016-04-10 NOTE — Progress Notes (Addendum)
SYMPTOM MANAGEMENT CLINIC    Chief Complaint: Diarrhea, dehydration  HPI:  Amy Holmes 60 y.o. female diagnosed with endometrial cancer.  Currently undergoing Taxol/carboplatin chemotherapy regimen.  Patient is status post pelvic radiation; and plans to undergo vaginal brachytherapy in the future.  Patient presented to the [facility] today with diarrhea and dehydration.   No history exists.    Review of Systems  Constitutional: Positive for malaise/fatigue.  Gastrointestinal: Positive for diarrhea.  All other systems reviewed and are negative.   Past Medical History:  Diagnosis Date  . #[id number] 12/2015   endometrial  . Anxiety and depression    situational  . Insomnia   . Obesity   . Palpitations    2 months ago, stopped with energy drinks stopped  . Tobacco abuse     Past Surgical History:  Procedure Laterality Date  . CHOLECYSTECTOMY  2007  . LUMBAR DISC SURGERY     x 2  . LYMPH NODE DISSECTION N/A 01/19/2016   Procedure: sentienal LYMPH NODE mapping, bilateral pelvic lymph node dissection;  Surgeon: Everitt Amber, MD;  Location: [facility];  Service: Gynecology;  Laterality: N/A;  . ROBOTIC ASSISTED TOTAL HYSTERECTOMY WITH BILATERAL SALPINGO OOPHERECTOMY N/A 01/19/2016   Procedure: XI ROBOTIC ASSISTED TOTAL HYSTERECTOMY WITH BILATERAL SALPINGO OOPHORECTOMY;  Surgeon: Everitt Amber, MD;  Location: [facility];  Service: Gynecology;  Laterality: N/A;  . TUBAL LIGATION      has Smoker; Cough; Endometrial cancer ([place]); Environmental allergies; Continuous tobacco abuse; [employer] Federation of Gyne[employer] and Obstetric[employer] (FIGO) stage IIIC1 malignant neoplasm of endometrium ([place]); Status post cholecystectomy; Dehydration; and Diarrhea on her problem list.    has No Known Allergies.    Medication List       Accurate as of 04/10/16  5:46 PM. Always use your most recent med list.          aspirin EC 81 MG tablet Take 81 mg by mouth daily.   CALCIUM CARBONATE PO Take 1  tablet by mouth daily.   Coenzyme Q10 100 MG Tabs Take 100 mg by mouth daily.   diphenoxylate-atropine 2.5-0.025 MG tablet Commonly known as:  LOMOTIL Take 2 tablets by mouth 4 (four) times daily as needed for diarrhea or loose stools.   fexofenadine 180 MG tablet Commonly known as:  ALLEGRA Take 180 mg by mouth daily as needed for allergies or rhinitis. Reported on 03/12/2016   loperamide 2 MG tablet Commonly known as:  IMODIUM A-D Take 2 mg by mouth 4 (four) times daily as needed for diarrhea or loose stools.   loratadine 10 MG tablet Commonly known as:  CLARITIN Take 10 mg by mouth daily as needed for allergies. Reported on 03/12/2016   LORazepam 1 MG tablet Commonly known as:  ATIVAN Place 1/2-1 tablet under the tongue or swallow every 6 hours as needed for nausea. Will make drowsy   potassium chloride 10 MEQ tablet Commonly known as:  K-DUR Take 2 tablets (20 mEq total) by mouth daily.   prochlorperazine 10 MG tablet Commonly known as:  COMPAZINE Take 1 tablet (10 mg total) by mouth every 6 (six) hours as needed for nausea or vomiting.   traMADol 50 MG tablet Commonly known as:  ULTRAM Take 1 tablet (50 mg total) by mouth every 6 (six) hours as needed for severe pain.   vitamin C 500 MG tablet Commonly known as:  ASCORBIC ACID Take 500 mg by mouth daily.   zolpidem 5 MG tablet Commonly known as:  AMBIEN  Take 1 tablet (5 mg total) by mouth at bedtime as needed for sleep. Do not take with pain meds        PHYSICAL EXAMINATION  Oncology Vitals 04/10/2016 04/10/2016  Height - 168 cm  Weight - 89.585 kg  Weight (lbs) - 197 lbs 8 oz  BMI (kg/m2) - 31.88 kg/m2  Temp - 98.5  Pulse 72 80  Resp - 18  SpO2 - 100  BSA (m2) - 2.04 m2   BP Readings from Last 2 Encounters:  04/10/16 (!) 158/69  04/10/16 (!) 153/78    Physical Exam  Constitutional: She is oriented to person, place, and time. Vital signs are normal. She appears dehydrated. She appears unhealthy.  HENT:   Head: Normocephalic and atraumatic.  Eyes: Conjunctivae and EOM are normal. Pupils are equal, round, and reactive to light. Right eye exhibits no discharge. Left eye exhibits no discharge. No scleral icterus.  Neck: Normal range of motion.  Pulmonary/Chest: Effort normal. No respiratory distress.  Musculoskeletal: Normal range of motion. She exhibits no edema, tenderness or deformity.  Neurological: She is alert and oriented to person, place, and time. Gait normal.  Skin: Skin is warm and dry. No rash noted. No erythema. There is pallor.  Psychiatric: Affect normal.  Nursing note and vitals reviewed.   LABORATORY DATA:. Appointment on 04/10/2016  Component Date Value Ref Range Status  . WBC 04/10/2016 3.9  3.9 - 10.3 10e3/uL Final  . NEUT# 04/10/2016 2.5  1.5 - 6.5 10e3/uL Final  . HGB 04/10/2016 12.9  11.6 - 15.9 g/dL Final  . HCT 04/10/2016 39.1  34.8 - 46.6 % Final  . Platelets 04/10/2016 174  145 - 400 10e3/uL Final  . MCV 04/10/2016 84.7  79.5 - 101.0 fL Final  . MCH 04/10/2016 28.0  25.1 - 34.0 pg Final  . MCHC 04/10/2016 33.0  31.5 - 36.0 g/dL Final  . RBC 04/10/2016 4.61  3.70 - 5.45 10e6/uL Final  . RDW 04/10/2016 15.3* 11.2 - 14.5 % Final  . lymph# 04/10/2016 0.5* 0.9 - 3.3 10e3/uL Final  . MONO# 04/10/2016 0.4  0.1 - 0.9 10e3/uL Final  . Eosinophils Absolute 04/10/2016 0.6* 0.0 - 0.5 10e3/uL Final  . Basophils Absolute 04/10/2016 0.0  0.0 - 0.1 10e3/uL Final  . NEUT% 04/10/2016 63.6  38.4 - 76.8 % Final  . LYMPH% 04/10/2016 11.6* 14.0 - 49.7 % Final  . MONO% 04/10/2016 9.6  0.0 - 14.0 % Final  . EOS% 04/10/2016 15.0* 0.0 - 7.0 % Final  . BASO% 04/10/2016 0.2  0.0 - 2.0 % Final  . Sodium 04/10/2016 135* 136 - 145 mEq/L Final  . Potassium 04/10/2016 3.9  3.5 - 5.1 mEq/L Final  . Chloride 04/10/2016 101  98 - 109 mEq/L Final  . CO2 04/10/2016 25  22 - 29 mEq/L Final  . Glucose 04/10/2016 129  70 - 140 mg/dl Final  . BUN 04/10/2016 12.2  7.0 - 26.0 mg/dL Final  .  Creatinine 04/10/2016 0.8  0.6 - 1.1 mg/dL Final  . Total Bilirubin 04/10/2016 0.56  0.20 - 1.20 mg/dL Final  . Alkaline Phosphatase 04/10/2016 59  40 - 150 U/L Final  . AST 04/10/2016 14  5 - 34 U/L Final  . ALT 04/10/2016 23  0 - 55 U/L Final  . Total Protein 04/10/2016 7.3  6.4 - 8.3 g/dL Final  . Albumin 04/10/2016 4.1  3.5 - 5.0 g/dL Final  . Calcium 04/10/2016 9.4  8.4 - 10.4 mg/dL Final  .  Anion Gap 04/10/2016 9  3 - 11 mEq/L Final  . EGFR 04/10/2016 79* >90 ml/min/1.73 m2 Final  . Magnesium 04/10/2016 2.1  1.5 - 2.5 mg/dl Final    RADIOGRAPHIC STUDIES: No results found.  ASSESSMENT/PLAN:    Endometrial cancer [facility]) Patient received cycle 2 of her Taxol/carboplatin chemotherapy regimen on 04/05/2016.  She also completed her final radiation treatment to the pelvis region on 04/05/2016 as well.  The plan is for the patient to continue with chemotherapy; and to next undergo vaginal brachytherapy as well.  Patient is now scheduled for labs only on 05/09/2016.  Will review all with Dr. Marko Plume; and confirm with patient is to be seen next to the [facility].  Diarrhea Patient developed significant diarrhea over this past weekend.  She has been taking up to 8 Imodium per day; with minimal effectiveness.  She feels dehydrated today.  Patient was given Lomotil 2 tablets while at the [facility].  She will receive IV fluid rehydration while at the [facility].  Carefully reviewed the need for a clear liquid diet until patient is able to tolerate a bland diet until diarrhea clears.  Patient was also given a prescription for Lomotil to alternate with the Imodium as well.  Patient was advised to call/return or go directly to the emergency department for any worsening symptoms whatsoever.  Note: Patient attempted to obtain a stool to rule out C. difficile today; but was unable to give a stool sample.  Patient will be sent home with a stool kit to rule out C.  difficile.  Dehydration Patient developed significant diarrhea over this past weekend.  She has been taking up to 8 Imodium per day; with minimal effectiveness.  She feels dehydrated today.  Patient was given Lomotil 2 tablets while at the [facility].  She will receive IV fluid rehydration while at the [facility].  Carefully reviewed the need for a clear liquid diet until patient is able to tolerate a bland diet until diarrhea clears.  Patient was also given a prescription for Lomotil to alternate with the Imodium as well.  Patient was advised to call/return or go directly to the emergency department for any worsening symptoms whatsoever.   Patient stated understanding of all instructions; and was in agreement with this plan of care. The patient knows to call the clinic with any problems, questions or concerns.   Total time spent with patient was 25 minutes;  with greater than 75 percent of that time spent in face to face counseling regarding patient's symptoms,  and coordination of care and follow up.  Disclaimer:This dictation was prepared with Dragon/digital dictation along with [employer]. Any transcriptional errors that result from this process are unintentional.  Drue Second, NP 04/10/2016

## 2016-04-10 NOTE — Telephone Encounter (Signed)
per pof to sch pt appt-pt aware of appt °

## 2016-04-11 ENCOUNTER — Telehealth: Payer: Self-pay | Admitting: Oncology

## 2016-04-11 ENCOUNTER — Telehealth: Payer: Self-pay | Admitting: *Deleted

## 2016-04-11 ENCOUNTER — Other Ambulatory Visit: Payer: Self-pay | Admitting: Oncology

## 2016-04-11 DIAGNOSIS — C541 Malignant neoplasm of endometrium: Secondary | ICD-10-CM

## 2016-04-11 NOTE — Telephone Encounter (Signed)
TC to patient in follow up to [facility] visit on 04/10/16. No answer but was able to leave voicemail message.  Asked patient if she took home a kit for obtaining stool specimen. Provided [phone] # for pt to return call.  Next appt with Dr. Marko Plume is 04/19/16

## 2016-04-11 NOTE — Telephone Encounter (Signed)
lvm to inform pt of 8/10 appt date/times per [facility] pof

## 2016-04-12 ENCOUNTER — Telehealth: Payer: Self-pay | Admitting: *Deleted

## 2016-04-12 ENCOUNTER — Telehealth: Payer: Self-pay

## 2016-04-12 NOTE — Telephone Encounter (Signed)
Left message for pt letting her know we wanted to check on her and see how she was feeling.  Told pt to give [place] a call so we could do that.

## 2016-04-12 NOTE — Telephone Encounter (Signed)
Called patient to remind of [facility]DR [facility] for 04/16/16, lvm for a return call

## 2016-04-13 ENCOUNTER — Telehealth: Payer: Self-pay | Admitting: Oncology

## 2016-04-13 ENCOUNTER — Telehealth: Payer: Self-pay

## 2016-04-13 NOTE — Telephone Encounter (Signed)
LM for Ms Basgall stating that the message concerning her appointments for next week -HDR radiation- saw that she spoke with [name] and postponed HDR to 04-24-16. Noticed that she mentioned to [name] that she is still having diarrhea. Encouraged her to return phone call to discuss diarrhea and see how she is using the lomotil and if she is taking in at least 64 oz of fluid or if she needs to come in to be evaluated and possibly receive more IVF. Noted her appointment with Dr. Marko Plume next week on 04-19-16 and encouraged her to keep that appointment.

## 2016-04-13 NOTE — Telephone Encounter (Signed)
Amy Holmes called and said she would like to cancel her first HDR appointment on Monday because she is still having diarrhea.  She said she would like another week to recover before starting the internal radiation treatments.  Her first HDR appointment has been rescheduled to 04/24/16.  Left patient a message advising her of the new appointment times and requesting a return call to verify timing.

## 2016-04-13 NOTE — Telephone Encounter (Signed)
Amy Holmes called back and confirmed the appointment times for 04/24/16.

## 2016-04-16 ENCOUNTER — Inpatient Hospital Stay: Admission: RE | Admit: 2016-04-16 | Payer: Self-pay | Source: Ambulatory Visit | Admitting: Radiation Oncology

## 2016-04-16 ENCOUNTER — Ambulatory Visit: Payer: 59 | Admitting: Radiation Oncology

## 2016-04-16 ENCOUNTER — Encounter: Payer: Self-pay | Admitting: Radiation Oncology

## 2016-04-17 ENCOUNTER — Other Ambulatory Visit: Payer: Self-pay | Admitting: Oncology

## 2016-04-17 ENCOUNTER — Encounter: Payer: Self-pay | Admitting: Radiation Oncology

## 2016-04-17 DIAGNOSIS — C541 Malignant neoplasm of endometrium: Secondary | ICD-10-CM

## 2016-04-18 ENCOUNTER — Encounter: Payer: Self-pay | Admitting: Radiation Oncology

## 2016-04-18 ENCOUNTER — Encounter: Payer: Self-pay | Admitting: Skilled Nursing Facility1

## 2016-04-18 NOTE — Progress Notes (Signed)
To assist the pt in identifying dietary strategies to gain lost wt.   Pt identified as being malnourished due to wt loss. Pt contacted via the telephone at [phone]. Pt was unavailable.   Dietitian left a message prompting the pt to contact [place] at [phone].

## 2016-04-19 ENCOUNTER — Ambulatory Visit: Payer: 59 | Admitting: Oncology

## 2016-04-19 ENCOUNTER — Other Ambulatory Visit: Payer: 59

## 2016-04-23 ENCOUNTER — Telehealth: Payer: Self-pay | Admitting: *Deleted

## 2016-04-23 NOTE — Telephone Encounter (Signed)
CALLED PATIENT TO REMIND OF HDR [facility] FOR 04/24/16, LVM FOR A RETURN CALL

## 2016-04-24 ENCOUNTER — Ambulatory Visit
Admission: RE | Admit: 2016-04-24 | Discharge: 2016-04-24 | Disposition: A | Discharge status: Home / Self Care | Payer: 59 | Source: Ambulatory Visit | Attending: Radiation Oncology | Admitting: Radiation Oncology

## 2016-04-24 ENCOUNTER — Encounter: Payer: Self-pay | Admitting: Radiation Oncology

## 2016-04-24 VITALS — BP 156/95 | HR 81 | Temp 98.6°F | Ht 66.0 in | Wt 195.4 lb

## 2016-04-24 DIAGNOSIS — C541 Malignant neoplasm of endometrium: Secondary | ICD-10-CM

## 2016-04-24 DIAGNOSIS — Z510 Encounter for antineoplastic radiation therapy: Secondary | ICD-10-CM | POA: Diagnosis not present

## 2016-04-24 NOTE — Progress Notes (Signed)
  Radiation Oncology         [phone]) [phone] ________________________________  Name: Amy Holmes MRN: [id number]  Date: 04/24/2016  DOB: [birth year]  SIMULATION AND TREATMENT PLANNING NOTE HDR BRACHYTHERAPY  DIAGNOSIS:  Stage IIIC1 high grade endometrioid adenocarcinoma  NARRATIVE:  The patient was brought to the [place] suite.  Identity was confirmed.  All relevant records and images related to the planned course of therapy were reviewed.  The patient freely provided informed written consent to proceed with treatment after reviewing the details related to the planned course of therapy. The consent form was witnessed and verified by the simulation staff.  Then, the patient was set-up in a stable reproducible  supine position for radiation therapy.  CT images were obtained.  Surface markings were placed.  The CT images were loaded into the planning software.  Then the target and avoidance structures were contoured.  Treatment planning then occurred.  The radiation prescription was entered and confirmed.   I have requested : Brachytherapy Isodose Plan and Dosimetry Calculations to plan the radiation distribution.    PLAN:  The patient will receive 18 Gy in 3 fractions. Iridium 192 will be the high dose rate source. The patient will be treated with a 3 cm diameter cylinder, treatment length was 3 cm, prescription to the mucosal surface ________________________________  Blair Promise, PhD, MD  This document serves as a record of services personally performed by Gery Pray, MD. It was created on his behalf by Darcus Austin, a trained medical scribe. The creation of this record is based on the scribe's personal observations and the provider's statements to them. This document has been checked and approved by the attending provider.

## 2016-04-24 NOTE — Progress Notes (Signed)
Please see the Nurse Progress Note in the MD Initial Consult Encounter for this patient. 

## 2016-04-24 NOTE — Progress Notes (Signed)
  Radiation Oncology         [phone]) [phone] ________________________________  Name: Amy Holmes MRN: [id number]  Date: 04/24/2016  DOB: 07-Oct-1955  CC: Lucretia Kern., DO  Everitt Amber, MD   HDR BRACHYTHERAPY NOTE  DIAGNOSIS: Stage IIIC1 high grade endometrioid adenocarcinoma   Simple treatment device note: Patient had construction of her custom vaginal cylinder. She will be treated with a 3 cm diameter segmented cylinder. This conforms to her anatomy without undue discomfort.  Vaginal brachytherapy procedure node: The patient was brought to the [place] suite. Identity was confirmed. All relevant records and images related to the planned course of therapy were reviewed. The patient freely provided informed written consent to proceed with treatment after reviewing the details related to the planned course of therapy. The consent form was witnessed and verified by the simulation staff. Then, the patient was set-up in a stable reproducible supine position for radiation therapy. The patient's custom vaginal cylinder was placed in the proximal vagina. This was affixed to the CT/MR stabilization plate to prevent slippage. Patient tolerated the placement well.  Verification simulation note:  A fiducial marker was placed within the vaginal cylinder. An AP and lateral film was then obtained through the pelvis area. This documented accurate position of the vaginal cylinder for treatment.  HDR BRACHYTHERAPY TREATMENT  The remote afterloading device was affixed to the vaginal cylinder by catheter. Patient then proceeded to undergo her first high-dose-rate treatment directed at the proximal vagina. The patient was prescribed a dose of 6 gray to be delivered to the mucosal surface. Treatment length was 3 cm. Patient was treated with 1 channel using 7 dwell positions. Treatment time was 251.8 seconds. Iridium 192 was the high-dose-rate source for treatment. The patient tolerated the treatment well. After completion  of her therapy, a radiation survey was performed documenting return of the iridium source into the [employer] safe.   PLAN: She will return for her second HDR brachytherapy treatment on 05/02/16. ________________________________  Blair Promise, PhD, MD   This document serves as a record of services personally performed by Gery Pray, MD. It was created on his behalf by Darcus Austin, a trained medical scribe. The creation of this record is based on the scribe's personal observations and the provider's statements to them. This document has been checked and approved by the attending provider.

## 2016-04-24 NOTE — Progress Notes (Signed)
Radiation Oncology         ([phone]) [phone] ________________________________  Name: Amy Holmes MRN: [id number]  Date: 04/24/2016  DOB: Jan 17, 1956  n Vaginal Brachytherapy Procedure Note  CC: Lucretia Kern., DO Everitt Amber, MD    ICD-9-CM ICD-10-CM   1. [employer]ne[employer] (FIGO) stage IIIC1 malignant neoplasm of endometrium ([place]) 182.0 C54.1   2. Endometrial cancer ([place]) 182.0 C54.1     Diagnosis: Stage IIIC1 high grade endometrioid adenocarcinoma  Radiation Treatment Dates: 03/01/16 - 04/05/16: 45 Gy in 25 fractions to the pelvis.  Narrative: She returns today for vaginal cylinder fitting. She denies pain. She reports severe diarrhea after completing radiation and chemotherapy. She received additional IV fluids in Medical Oncology due to dehydration. She reports feeling better and has not had diarrhea over the last week. She denies urinary symptoms, including burning or frequency. She is eating well per her report. She is exercising at the gym by power walking 2 miles a day. She has no other concerns at this time.  ALLERGIES: has No Known Allergies.  Meds: Current Outpatient Prescriptions  Medication Sig Dispense Refill  . aspirin EC 81 MG tablet Take 81 mg by mouth daily.    Marland Kitchen CALCIUM CARBONATE PO Take 1 tablet by mouth daily.    . Coenzyme Q10 100 MG TABS Take 100 mg by mouth daily.    . diphenoxylate-atropine (LOMOTIL) 2.5-0.025 MG tablet Take 2 tablets by mouth 4 (four) times daily as needed for diarrhea or loose stools. (Patient not taking: Reported on 04/24/2016) 30 tablet 0  . fexofenadine (ALLEGRA) 180 MG tablet Take 180 mg by mouth daily as needed for allergies or rhinitis. Reported on 03/12/2016    . loperamide (IMODIUM A-D) 2 MG tablet Take 2 mg by mouth 4 (four) times daily as needed for diarrhea or loose stools.    Marland Kitchen loratadine (CLARITIN) 10 MG tablet Take 10 mg by mouth daily as needed for allergies. Reported on 03/12/2016    . LORazepam  (ATIVAN) 1 MG tablet Place 1/2-1 tablet under the tongue or swallow every 6 hours as needed for nausea. Will make drowsy (Patient not taking: Reported on 04/24/2016) 20 tablet 0  . potassium chloride (K-DUR) 10 MEQ tablet Take 2 tablets (20 mEq total) by mouth daily. (Patient not taking: Reported on 04/24/2016) 30 tablet 0  . prochlorperazine (COMPAZINE) 10 MG tablet Take 1 tablet (10 mg total) by mouth every 6 (six) hours as needed for nausea or vomiting. (Patient not taking: Reported on 04/24/2016) 30 tablet 0  . traMADol (ULTRAM) 50 MG tablet Take 1 tablet (50 mg total) by mouth every 6 (six) hours as needed for severe pain. (Patient not taking: Reported on 04/24/2016) 30 tablet 0  . vitamin C (ASCORBIC ACID) 500 MG tablet Take 500 mg by mouth daily.    Marland Kitchen zolpidem (AMBIEN) 5 MG tablet Take 1 tablet (5 mg total) by mouth at bedtime as needed for sleep. Do not take with pain meds (Patient not taking: Reported on 04/24/2016) 15 tablet 0   No current facility-administered medications for this encounter.     Physical Findings: The patient is in no acute distress. Patient is alert and oriented. height is 5\' 6"  (1.676 m) and weight is 195 lb 6.4 oz (88.6 kg). Her temperature is 98.6 F (37 C). Her blood pressure is 156/95 (abnormal) and her pulse is 81. Her oxygen saturation is 100%.  The heart has a regular rate and rhythm. The lungs  are clear to auscultation. Abdomen soft and non-tender.  On pelvic examination the external genitalia were unremarkable. A speculum exam was performed. Vaginal cuff intact, no mucosal lesions. On bimanual exam there were no pelvic masses appreciated.  Patient was fitted for a vaginal cylinder. The patient will be treated with a 3 cm diameter cylinder with a treatment length of 3 cm. This distended the vaginal vault without undue discomfort. The patient tolerated the procedure well.  Lab Findings: Lab Results  Component Value Date   WBC 3.9 04/10/2016   HGB 12.9 04/10/2016    HCT 39.1 04/10/2016   MCV 84.7 04/10/2016   PLT 174 04/10/2016    Radiographic Findings: No results found.  Impression: Stage IIIC1 high grade endometrioid adenocarcinoma  The patient was successfully fitted for a vaginal cylinder. The patient is appropriate to begin vaginal brachytherapy.   Plan: The patient will proceed with CT simulation and vaginal brachytherapy today.  _______________________________   Blair Promise, PhD, MD  This document serves as a record of services personally performed by Gery Pray, MD. It was created on his behalf by Darcus Austin, a trained medical scribe. The creation of this record is based on the scribe's personal observations and the provider's statements to them. This document has been checked and approved by the attending provider.

## 2016-04-24 NOTE — Progress Notes (Signed)
Amy Holmes is here for her HDR treatment. She denies pain. She reports severe diarrhea after completing radiation and chemotherapy. She received additional IV fluids in Medical Oncology due to dehydration. She tells me she feels better and has not had diarrhea over the last week. She denies urinary symptoms including burning or frequency. She is eating well per her report. She is exercising at the gym by power walking 2 miles a day. She has no other concerns at this time.   BP (!) 156/95   Pulse 81   Temp 98.6 F (37 C)   Ht 5\' 6"  (1.676 m)   Wt 195 lb 6.4 oz (88.6 kg)   SpO2 100% Comment: room air  BMI 31.54 kg/m    Wt Readings from Last 3 Encounters:  04/24/16 195 lb 6.4 oz (88.6 kg)  04/10/16 197 lb 8 oz (89.6 kg)  04/03/16 202 lb 14.4 oz (92 kg)

## 2016-04-25 ENCOUNTER — Telehealth: Payer: Self-pay

## 2016-04-25 NOTE — Telephone Encounter (Signed)
-----   Message from Gordy Levan, MD sent at 04/19/2016  5:50 PM EDT ----- [facility] 8-10. RN please try to check on her, see what we can help with and how to get reorganized I can talk with her if needed  thanks

## 2016-04-25 NOTE — Telephone Encounter (Signed)
Attempted to contact Ms Ginder  about follow up appointment.  Phone rang and then disconnected. X 2.

## 2016-04-27 NOTE — Telephone Encounter (Signed)
Telephone   04/13/2016 [facility]  Baruch Merl, RN   Follow-up , Appointment   Reason for call   Conversation: Follow-up  (Newest Message First)  April 27, 2016  Me      3:39 PM  Note    Lm for Amy Holmes to call Dr. Mariana Kaufman nurse to let Dr. Marko Plume know if she is open to coming for an appointment in the next 1-2 weeks to discuss chemotherapy and or follow up care.          3:38 PM    You attempted to contact Amy Holmes (Left Message)  April 13, 2016  Me      10:53 AM  Note    LM for Amy Holmes stating that the message concerning her appointments for next week -HDR radiation- saw that she spoke with [name] and postponed HDR to 04-24-16. Noticed that she mentioned to [name] that she is still having diarrhea. Encouraged her to return phone call to discuss diarrhea and see how she is using the lomotil and if she is taking in at least 64 oz of fluid or if she needs to come in to be evaluated and possibly receive more IVF. Noted her appointment with Dr. Marko Plume next week on 04-19-16 and encouraged her to keep that appointment.          10:51 AM    You attempted to contact Amy Holmes (Left Message)  Additional Documentation   Encounter Info:   Billing Info,   History,   Allergies,   Detailed Report     Linked Episodes    ONCOLOGY TREATMENT Noted 02/08/2016    Orders Placed    None  Medication Renewals and Changes     None    Medication List  Visit Diagnoses     None    Problem List

## 2016-04-27 NOTE — Telephone Encounter (Signed)
Lm for Amy Holmes to call Dr. Mariana Kaufman nurse to let Dr. Marko Plume know if she is open to coming for an appointment in the next 1-2 weeks to discuss chemotherapy and or follow up care.

## 2016-05-01 NOTE — Progress Notes (Signed)
  Radiation Oncology         [phone]) [phone] ________________________________  Name: Amy Holmes MRN: [id number]  Date: 05/02/2016  DOB: 1956/08/03  CC: Lucretia Kern., DO  Everitt Amber, MD   HDR BRACHYTHERAPY NOTE  DIAGNOSIS: Stage IIIC1 high grade endometrioid adenocarcinoma   Simple treatment device note: Patient had construction of her custom vaginal cylinder. She will be treated with a 3 cm diameter segmented cylinder. This conforms to her anatomy without undue discomfort.  Vaginal brachytherapy procedure node: The patient was brought to the [place] suite. Identity was confirmed. All relevant records and images related to the planned course of therapy were reviewed. The patient freely provided informed written consent to proceed with treatment after reviewing the details related to the planned course of therapy. The consent form was witnessed and verified by the simulation staff. Then, the patient was set-up in a stable reproducible supine position for radiation therapy.  Pelvic exam revealed the vaginal cuff to be intact. The patient's custom vaginal cylinder was placed in the proximal vagina. This was affixed to the CT/MR stabilization plate to prevent slippage. Patient tolerated the placement well.  Verification simulation note:  A fiducial marker was placed within the vaginal cylinder. An AP and lateral film was then obtained through the pelvis area. This documented accurate position of the vaginal cylinder for treatment.  HDR BRACHYTHERAPY TREATMENT  The remote afterloading device was affixed to the vaginal cylinder by catheter. Patient then proceeded to undergo her second high-dose-rate treatment directed at the proximal vagina. The patient was prescribed a dose of 6 gray to be delivered to the mucosal surface. Treatment length was 3 cm. Patient was treated with 1 channel using 7 dwell positions. Treatment time was 271.3 seconds. Iridium 192 was the high-dose-rate source for treatment. The  patient tolerated the treatment well. After completion of her therapy, a radiation survey was performed documenting return of the iridium source into the [employer] safe.   PLAN: She will return for her third HDR brachytherapy treatment on 05/08/16. ________________________________  Blair Promise, PhD, MD   This document serves as a record of services personally performed by Gery Pray, MD. It was created on his behalf by Darcus Austin, a trained medical scribe. The creation of this record is based on the scribe's personal observations and the provider's statements to them. This document has been checked and approved by the attending provider.

## 2016-05-02 ENCOUNTER — Telehealth: Payer: Self-pay

## 2016-05-02 ENCOUNTER — Ambulatory Visit
Admission: RE | Admit: 2016-05-02 | Discharge: 2016-05-02 | Disposition: A | Discharge status: Home / Self Care | Payer: 59 | Source: Ambulatory Visit | Attending: Radiation Oncology | Admitting: Radiation Oncology

## 2016-05-02 DIAGNOSIS — C541 Malignant neoplasm of endometrium: Secondary | ICD-10-CM

## 2016-05-02 DIAGNOSIS — Z510 Encounter for antineoplastic radiation therapy: Secondary | ICD-10-CM | POA: Diagnosis present

## 2016-05-02 NOTE — Telephone Encounter (Signed)
Amy Holmes spoke with Amy Holmes today while patient here for HDR therapy about a follow up appointment with Amy Holmes to discuss further treatments. Amy Holmes feels that something is wrong since more chemotherapy is being added to her treatments.   Amy Holmes tried to explan that  this is how the treatment is for her type of cancer. Pt would like to talk with Amy Holmes nurse first prior to making appointments.  Amy Holmes said to call hr first thing tomorrow morning on her cell phone. [phone].

## 2016-05-03 ENCOUNTER — Telehealth: Payer: Self-pay | Admitting: Oncology

## 2016-05-03 ENCOUNTER — Other Ambulatory Visit: Payer: Self-pay | Admitting: Oncology

## 2016-05-03 ENCOUNTER — Ambulatory Visit: Payer: 59 | Admitting: Oncology

## 2016-05-03 ENCOUNTER — Ambulatory Visit: Payer: 59

## 2016-05-03 NOTE — Telephone Encounter (Signed)
Medical Oncology  Patient has not kept appointments with medical oncology since #2 CDDP on 04-05-16. She has not started additional 4 cycles of carboplatin taxol as planned for IIIC1 grade 3 endometrial carcinoma.   Note seen re radiation oncology RN spoke with patient 05-02-16, with patient's request that she be called on cell [phone] "first thing" today.  No answer on cell [phone], LM for her to call back to office to speak with MD, also said that I would  try to reach her again.  Godfrey Pick, MD

## 2016-05-03 NOTE — Telephone Encounter (Signed)
Dr. Marko Plume spoke with Amy Holmes this am and discussed appointments.

## 2016-05-03 NOTE — Telephone Encounter (Signed)
Medical Oncology  Patient returned my earlier calls this AM, reviewed with her plans for 4 cycles of carboplatin taxol after initial chemoradiation. Patient agreed to seeing me this afternoon 1600 with lab, to include review of  taxol carboplatin. Patient tells me that she is feeling very well.  Urgent scheduling message sent for today.  Patient called back to let us know she will not be able to keep appointments today. Asked to be seen and to have chemo late next week.  I called now and spoke again directly with patient, explaining that she needs to be seen, to have labs done and to review and set up this different chemotherapy. As she is unable to come today, she agrees to lab + MD 1600 on 8-28 as only available spot. I will request first taxol carbo 8-31 or 9-1. Patient understands that she cannot be treated until seen by MD as above, states that she will keep the appointment on 8-28.  Scheduling message sent now. Chemo orders placed for preauthorization.  Godfrey Pick, MD

## 2016-05-03 NOTE — Telephone Encounter (Signed)
Medical Oncology  As patient had specifically asked for phone call first thing today, I tried cell again now, again went to VM.  Left message again requesting that she call back, told her that I am glad to speak with her.  Godfrey Pick, MD

## 2016-05-05 ENCOUNTER — Other Ambulatory Visit: Payer: Self-pay | Admitting: Oncology

## 2016-05-07 ENCOUNTER — Ambulatory Visit (HOSPITAL_BASED_OUTPATIENT_CLINIC_OR_DEPARTMENT_OTHER): Payer: 59 | Admitting: Oncology

## 2016-05-07 ENCOUNTER — Other Ambulatory Visit (HOSPITAL_BASED_OUTPATIENT_CLINIC_OR_DEPARTMENT_OTHER): Payer: 59

## 2016-05-07 ENCOUNTER — Encounter: Payer: Self-pay | Admitting: Oncology

## 2016-05-07 VITALS — BP 166/63 | HR 80 | Temp 99.3°F | Resp 18 | Ht 66.0 in | Wt 196.1 lb

## 2016-05-07 DIAGNOSIS — C541 Malignant neoplasm of endometrium: Secondary | ICD-10-CM

## 2016-05-07 DIAGNOSIS — Z91199 Patient's noncompliance with other medical treatment and regimen due to unspecified reason: Secondary | ICD-10-CM

## 2016-05-07 DIAGNOSIS — Z5111 Encounter for antineoplastic chemotherapy: Secondary | ICD-10-CM

## 2016-05-07 DIAGNOSIS — Z720 Tobacco use: Secondary | ICD-10-CM | POA: Diagnosis not present

## 2016-05-07 DIAGNOSIS — Z9111 Patient's noncompliance with dietary regimen: Secondary | ICD-10-CM

## 2016-05-07 LAB — COMPREHENSIVE METABOLIC PANEL
ALBUMIN: 4.2 g/dL (ref 3.5–5.0)
ALK PHOS: 79 U/L (ref 40–150)
ALT: 21 U/L (ref 0–55)
AST: 16 U/L (ref 5–34)
Anion Gap: 12 mEq/L — ABNORMAL HIGH (ref 3–11)
BUN: 16.6 mg/dL (ref 7.0–26.0)
CHLORIDE: 106 meq/L (ref 98–109)
CO2: 24 mEq/L (ref 22–29)
Calcium: 9.9 mg/dL (ref 8.4–10.4)
Creatinine: 0.8 mg/dL (ref 0.6–1.1)
EGFR: 81 mL/min/{1.73_m2} — AB (ref >90)
GLUCOSE: 122 mg/dL (ref 70–140)
POTASSIUM: 3.9 meq/L (ref 3.5–5.1)
SODIUM: 143 meq/L (ref 136–145)
Total Bilirubin: 0.44 mg/dL (ref 0.20–1.20)
Total Protein: 7.6 g/dL (ref 6.4–8.3)

## 2016-05-07 LAB — CBC WITH DIFFERENTIAL/PLATELET
BASO%: 0.6 % (ref 0.0–2.0)
BASOS ABS: 0 10*3/uL (ref 0.0–0.1)
EOS%: 3.8 % (ref 0.0–7.0)
Eosinophils Absolute: 0.1 10*3/uL (ref 0.0–0.5)
HCT: 34.7 % — ABNORMAL LOW (ref 34.8–46.6)
HEMOGLOBIN: 12 g/dL (ref 11.6–15.9)
LYMPH#: 0.8 10*3/uL — AB (ref 0.9–3.3)
LYMPH%: 22.4 % (ref 14.0–49.7)
MCH: 29.6 pg (ref 25.1–34.0)
MCHC: 34.6 g/dL (ref 31.5–36.0)
MCV: 85.7 fL (ref 79.5–101.0)
MONO#: 0.4 10*3/uL (ref 0.1–0.9)
MONO%: 11 % (ref 0.0–14.0)
NEUT#: 2.1 10*3/uL (ref 1.5–6.5)
NEUT%: 62.2 % (ref 38.4–76.8)
Platelets: 133 10*3/uL — ABNORMAL LOW (ref 145–400)
RBC: 4.05 10*6/uL (ref 3.70–5.45)
RDW: 17.1 % — AB (ref 11.2–14.5)
WBC: 3.4 10*3/uL — AB (ref 3.9–10.3)

## 2016-05-07 LAB — MAGNESIUM: Magnesium: 2.3 mg/dl (ref 1.5–2.5)

## 2016-05-07 NOTE — Progress Notes (Signed)
OFFICE PROGRESS NOTE   May 07, 2016   Physicians: Charlene Brooke, Nickola Major, DO, Gery Pray, Marylynn Pearson, (M.Wert)  INTERVAL HISTORY:  Patient is seen, together with husband, as we try to continue planned adjuvant therapy for IIIC1 high grade endometrial carcinoma. Patient has not kept appointments or returned several calls since last at this office on 04-02-16;  I reached her by phone on 8-24, however she subsequently cancelled work in appointment for that afternoon.   She and husband do not recall that recommendation was for additional chemotherapy after initial IMRT + CDDP, despite information documented in phone note from Dr Denman George 01-30-16, chemo education documentation 02-02-16 and my consultation note 02-07-16.  Treatment to date has been 45 Gy external beam radiation to pelvis 03-01-16 thru 04-05-16, given with CDDP 03-05-16  and 04-05-16 (days 1 and 28). She will have #3 HDR on 05-08-16, to complete vaginal brachytherapy.  She had teaching for carboplatin and taxol in addition to CDDP at chemo education class on 02-02-16, but obviously does not recall much of that. MD has reviewed the chemotherapy information today, and RN has done full teaching again today following MD.  Patient had a difficult time with radiation diarrhea, however that has completely resolved. She reports that she is feeling entirely well, with good energy, good appetite, no abdominal or pelvic discomfort, no respiratory symptoms, no bleeding, no fever or symptoms of infection, no LE swelling, no peripheral neuropathy.  Remainder of 10 point Review of Systems negative.    No central catheter MSI high. Genetics counseling scheduled for 05-09-16  Patient and husband have trip planned to [place] on 05-17-16, for class reunion and concert. Husband is particularly upset that additional chemo will interfere with these plans.   ONCOLOGIC HISTORY Patient had postmenopausal spotting 09-2014, with gyn exam and PAP then negative. She  had 3 days of spotting in Sept 2016, then slightly heavier spotting in 12-2015. US showed thickened endometrial stripe with normal size uterus, and 1.3 cm dermoid on left ovary. Hysteroscopy D&C 12-27-15 found grade 1 endometrial carcinoma. She was referred to Dr Denman George, with robotic assisted total hysterectomy with BSO and sentinel node evaluation done 01-19-16. Pathology [id number]) found grade 3 endometrial adenocarcinoma (poorly differentiated arising from well differentiated adenocarcinoma), 7.2 cm tumor invading into outer half of myometrium with benign cervix, tubes, ovaries. No definite LVSI. 1 of 8 right pelvic nodes with isolated tumor cell, 1 left pelvic node + in addition to 1 left pelvic node with isolated tumor cells and 4 left pelvic nodes negative. Patient was DC home within 24 hours, no prophylactic lovenox at DC.  GIven grade 3 pathology, she had CT CAP 02-02-16 on 02-02-16, with no clear distant disease and no obvious residual pelvic disease. Recommendation is for pelvic radiation with CDDP days 1 and 28, then 4 cycles of carboplatin taxol chemotherapy. First CDDP was given 03-05-16     Objective:  Vital signs in last 24 hours:  BP (!) 166/63 (BP Location: Left Arm, Patient Position: Sitting)   Pulse 80   Temp 99.3 F (37.4 C) (Oral)   Resp 18   Ht 5' 6"  (1.676 m)   Wt 196 lb 1.6 oz (89 kg)   SpO2 100%   BMI 31.65 kg/m   Weight down 4 lbs from 04-02-16. Looks comfortable tho upset re misunderstanding of treatment plans. Respirations not labored RA. Alert, oriented and appropriate. Ambulatory without difficulty.  No alopecia  HEENT:PERRL, sclerae not icteric. Oral mucosa moist  Respirations not labored  RA.  LE without edema  Remainder of entire visit spent in discussion.  Lab Results:  Results for orders placed or performed in visit on 05/07/16  CBC with Differential  Result Value Ref Range   WBC 3.4 (L) 3.9 - 10.3 10e3/uL   NEUT# 2.1 1.5 - 6.5 10e3/uL   HGB 12.0  11.6 - 15.9 g/dL   HCT 34.7 (L) 34.8 - 46.6 %   Platelets 133 (L) 145 - 400 10e3/uL   MCV 85.7 79.5 - 101.0 fL   MCH 29.6 25.1 - 34.0 pg   MCHC 34.6 31.5 - 36.0 g/dL   RBC 4.05 3.70 - 5.45 10e6/uL   RDW 17.1 (H) 11.2 - 14.5 %   lymph# 0.8 (L) 0.9 - 3.3 10e3/uL   MONO# 0.4 0.1 - 0.9 10e3/uL   Eosinophils Absolute 0.1 0.0 - 0.5 10e3/uL   Basophils Absolute 0.0 0.0 - 0.1 10e3/uL   NEUT% 62.2 38.4 - 76.8 %   LYMPH% 22.4 14.0 - 49.7 %   MONO% 11.0 0.0 - 14.0 %   EOS% 3.8 0.0 - 7.0 %   BASO% 0.6 0.0 - 2.0 %  Comprehensive metabolic panel  Result Value Ref Range   Sodium 143 136 - 145 mEq/L   Potassium 3.9 3.5 - 5.1 mEq/L   Chloride 106 98 - 109 mEq/L   CO2 24 22 - 29 mEq/L   Glucose 122 70 - 140 mg/dl   BUN 16.6 7.0 - 26.0 mg/dL   Creatinine 0.8 0.6 - 1.1 mg/dL   Total Bilirubin 0.44 0.20 - 1.20 mg/dL   Alkaline Phosphatase 79 40 - 150 U/L   AST 16 5 - 34 U/L   ALT 21 0 - 55 U/L   Total Protein 7.6 6.4 - 8.3 g/dL   Albumin 4.2 3.5 - 5.0 g/dL   Calcium 9.9 8.4 - 10.4 mg/dL   Anion Gap 12 (H) 3 - 11 mEq/L   EGFR 81 (L) >90 ml/min/1.73 m2  Magnesium  Result Value Ref Range   Magnesium 2.3 1.5 - 2.5 mg/dl        Studies/Results:  No results found.  Medications: I have reviewed the patient's current medications. Prescription for decadron 20 mg 12 hrs and 6 hrs prior to taxol Recommended claritin 10 mg daily x ~ 5 beginning day of chemo Antiemetics available at home from CDDP  DISCUSSION Patient tells me that she and husband thought that she would be finished with all treatment with last HDR this week. She does recall feeling overwhelmed with information around the diagnosis. Reviewed discrepancy between initial hysteroscopy D&C 12-2015 which had grade 1 endometrial carcinoma, then grade 3 poorly differentiated adenocarcinoma in the surgical path specimen from 01-19-16. I have explained that treatment recommendations were changed with identification of the more aggressive,  high grade cancer found in hysterectomy specimen. I have explained limitations in CT/ PET scans, which cannot pick up microscopic or other tiny amounts of viable cancer; I have told them that scans are regularly done at completion of all treatment, for reevaluation and post treatment baseline. I have explained rationale for direct treatment of primary areas of involvement with radiation, given with sensitizing CDDP chemotherapy, and for systemic treatment with chemotherapy using carboplatin and taxol We have reviewed possible side effects of carbo taxol, including nausea/ vomiting, drop in blood counts, hair loss ~ time of cycle 2, taxol aches, peripheral neuropathy. As above, full teaching was done again by RN, with written information also given again. As patient had  requested during our second phone conversation on 05-03-16, she is scheduled for first carbo taxol on 05-09-16. We have discussed timing of premedication steroids.  Husband initially was angry, however by completion of visit apologized for this. Patient states that she could stay in [place] if she does not feel well enough for the trip to [place] on 05-17-16, and husband could go without her.  Patient gave verbal consent for chemotherapy.   Assessment/Plan:  1.IIIC1 poorly differentiated endometrial adenocarcinoma arising in well differentiated endometrial adenocarcinoma: post complete debulking by Dr Denman George 01-19-16.  CDDP "day 28" with pelvic radiation on 04-05-16, and to complete HDR on 05-08-16. Patient had not understood recommendation for 4 cycles of carboplatin taxol, but is now in agreement to proceeding. She will have cycle 1 on 05-11-16 and I will see her with labs 05-15-16. Genetics testing recommended for high MSI, counseling scheduled 05-09-16. 2 radiation diarrhea resolved 3.long minimal tobacco abuse: previously counseled for tobacco cessation, will continue to address 4.overdue mammograms which we will try to address at least after  completion of treatment. 5.post cholecystectomy and lumbar disc surgery x2 6.no colonoscopy, which will also be addressed as possible 7.environmental allergies 8.benign arrhythmias on Holter 2014  All questions answered and they seem to understand and to be in agreement with recommendations and plans. They are reminded that they can call at any time if concerns. RN will speak with her by phone prior to first chemo to review times for decadron. Chemo orders placed, carbo AUC decreased to 5 due to recent pelvic radiation, at least for first cycle. Granix requested. Message to managed care for preauth. I will see her 9-5 and 9-14, may need another visit and/ or counts between those dates.  Time spent 40 min including >50% counseling and coordination of care. Route PCP, cc Drs Denman George and Delena Serve, MD   05/07/2016, 8:10 PM

## 2016-05-08 ENCOUNTER — Encounter: Payer: Self-pay | Admitting: Radiation Oncology

## 2016-05-08 ENCOUNTER — Encounter: Payer: Self-pay | Admitting: Genetic Counselor

## 2016-05-08 ENCOUNTER — Ambulatory Visit
Admission: RE | Admit: 2016-05-08 | Discharge: 2016-05-08 | Disposition: A | Discharge status: Home / Self Care | Payer: 59 | Source: Ambulatory Visit | Attending: Radiation Oncology | Admitting: Radiation Oncology

## 2016-05-08 DIAGNOSIS — C541 Malignant neoplasm of endometrium: Secondary | ICD-10-CM

## 2016-05-08 DIAGNOSIS — Z510 Encounter for antineoplastic radiation therapy: Secondary | ICD-10-CM | POA: Diagnosis not present

## 2016-05-08 MED ORDER — DEXAMETHASONE 4 MG PO TABS: ORAL_TABLET | ORAL | 3 refills | Status: DC

## 2016-05-08 NOTE — Progress Notes (Signed)
  Radiation Oncology         [phone]) [phone] ________________________________  Name: Amy Holmes MRN: [id number]  Date: 05/08/2016  DOB: [birth year]  CC: Lucretia Kern., DO  Everitt Amber, MD   HDR BRACHYTHERAPY NOTE  DIAGNOSIS: Stage IIIC1 high grade endometrioid adenocarcinoma   Simple treatment device note: Patient had construction of her custom vaginal cylinder. She will be treated with a 3 cm diameter segmented cylinder. This conforms to her anatomy without undue discomfort.  Vaginal brachytherapy procedure node: The patient was brought to the [facility] suite. Identity was confirmed. All relevant records and images related to the planned course of therapy were reviewed. The patient freely provided informed written consent to proceed with treatment after reviewing the details related to the planned course of therapy. The consent form was witnessed and verified by the simulation staff. Then, the patient was set-up in a stable reproducible supine position for radiation therapy.  Pelvic exam revealed the vaginal cuff to be intact. The patient's custom vaginal cylinder was placed in the proximal vagina. This was affixed to the CT/MR stabilization plate to prevent slippage. Patient tolerated the placement well.  Verification simulation note:  A fiducial marker was placed within the vaginal cylinder. An AP and lateral film was then obtained through the pelvis area. This documented accurate position of the vaginal cylinder for treatment.  HDR BRACHYTHERAPY TREATMENT  The remote afterloading device was affixed to the vaginal cylinder by catheter. Patient then proceeded to undergo her third high-dose-rate treatment directed at the proximal vagina. The patient was prescribed a dose of 6 gray to be delivered to the mucosal surface. Treatment length was 3 cm. Patient was treated with 1 channel using 7 dwell positions. Treatment time was 287.1 seconds. Iridium 192 was the high-dose-rate source for treatment. The  patient tolerated the treatment well. After completion of her therapy, a radiation survey was performed documenting return of the iridium source into the [employer] safe.   PLAN: The patient has completed her radiation therapy and will be scheduled for a one month follow up appointment with me. She will proceed with additional chemotherapy in the near future. ________________________________  Blair Promise, PhD, MD   This document serves as a record of services personally performed by Gery Pray, MD. It was created on his behalf by Truddie Hidden, a trained medical scribe. The creation of this record is based on the scribe's personal observations and the provider's statements to them. This document has been checked and approved by the attending provider.

## 2016-05-09 ENCOUNTER — Other Ambulatory Visit: Payer: 59

## 2016-05-09 ENCOUNTER — Encounter: Payer: 59 | Admitting: Genetic Counselor

## 2016-05-09 ENCOUNTER — Telehealth: Payer: Self-pay

## 2016-05-09 NOTE — Telephone Encounter (Signed)
LM for Amy Holmes in her cell phone VM Reviewing the times to take the decadron tabs  On Thursday 05-10-16 at 11 pm and 0500 on Friday 05-11-16. Recommended the use of Claritin tabs /generic as noted by Dr. Marko Plume. If she has any questions or concerns, she can call back to Dr. Mariana Kaufman nurse at [phone].

## 2016-05-09 NOTE — Telephone Encounter (Signed)
-----   Message from Gordy Levan, MD sent at 05/07/2016  5:11 PM EDT ----- Script for decadron 4 mg   Five tabs with food 12 hrs before taxol chemotherapy and five tablets with food 6 hrs before taxol #10 for 1 cycle , 3 RF  Please go over times for first chemo. Likely best to do this with #2 chemo also.  Please remind her to take claritin 10 mg starting day of chemo x ~ 5 days, as this may help taxol aches  Need to be sure she gets chemo follow up call  thanks

## 2016-05-11 ENCOUNTER — Other Ambulatory Visit: Payer: 59

## 2016-05-11 ENCOUNTER — Ambulatory Visit (HOSPITAL_BASED_OUTPATIENT_CLINIC_OR_DEPARTMENT_OTHER): Payer: 59

## 2016-05-11 ENCOUNTER — Other Ambulatory Visit: Payer: Self-pay | Admitting: *Deleted

## 2016-05-11 VITALS — BP 171/84 | HR 78 | Temp 98.6°F | Resp 17

## 2016-05-11 DIAGNOSIS — Z5111 Encounter for antineoplastic chemotherapy: Secondary | ICD-10-CM

## 2016-05-11 DIAGNOSIS — C541 Malignant neoplasm of endometrium: Secondary | ICD-10-CM

## 2016-05-11 MED ORDER — DIPHENHYDRAMINE HCL 50 MG/ML IJ SOLN
INTRAMUSCULAR | Status: AC
  Filled 2016-05-11: qty 1

## 2016-05-11 MED ORDER — SODIUM CHLORIDE 0.9 % IV SOLN
175.0000 mg/m2 | Freq: Once | INTRAVENOUS | Status: AC
  Administered 2016-05-11: 354 mg via INTRAVENOUS
  Filled 2016-05-11: qty 59

## 2016-05-11 MED ORDER — SODIUM CHLORIDE 0.9 % IV SOLN
641.5000 mg | Freq: Once | INTRAVENOUS | Status: AC
  Administered 2016-05-11: 640 mg via INTRAVENOUS
  Filled 2016-05-11: qty 64

## 2016-05-11 MED ORDER — TRAMADOL HCL 50 MG PO TABS: 50.0000 mg | ORAL_TABLET | Freq: Four times a day (QID) | ORAL | 0 refills | Status: DC | PRN

## 2016-05-11 MED ORDER — SODIUM CHLORIDE 0.9 % IV SOLN
Freq: Once | INTRAVENOUS | Status: AC
  Administered 2016-05-11: 12:00:00 via INTRAVENOUS
  Filled 2016-05-11: qty 4

## 2016-05-11 MED ORDER — SODIUM CHLORIDE 0.9 % IV SOLN
Freq: Once | INTRAVENOUS | Status: AC
  Administered 2016-05-11: 12:00:00 via INTRAVENOUS

## 2016-05-11 MED ORDER — DIPHENHYDRAMINE HCL 50 MG/ML IJ SOLN
50.0000 mg | Freq: Once | INTRAMUSCULAR | Status: AC
  Administered 2016-05-11: 50 mg via INTRAVENOUS

## 2016-05-11 MED ORDER — PROCHLORPERAZINE MALEATE 10 MG PO TABS: 10.0000 mg | ORAL_TABLET | Freq: Four times a day (QID) | ORAL | 0 refills | Status: DC | PRN

## 2016-05-11 MED ORDER — FAMOTIDINE IN NACL 20-0.9 MG/50ML-% IV SOLN
INTRAVENOUS | Status: AC
  Filled 2016-05-11: qty 50

## 2016-05-11 MED ORDER — SODIUM CHLORIDE 0.9 % IV SOLN
Freq: Once | INTRAVENOUS | Status: AC
  Administered 2016-05-11: 12:00:00 via INTRAVENOUS
  Filled 2016-05-11: qty 5

## 2016-05-11 MED ORDER — FAMOTIDINE IN NACL 20-0.9 MG/50ML-% IV SOLN
20.0000 mg | Freq: Once | INTRAVENOUS | Status: AC
  Administered 2016-05-11: 20 mg via INTRAVENOUS

## 2016-05-11 NOTE — Patient Instructions (Signed)
[facility] Discharge Instructions for Patients Receiving Chemotherapy  Today you received the following chemotherapy agents:  Taxol and Carboplatin  To help prevent nausea and vomiting after your treatment, we encourage you to take your nausea medication as ordered per MD.   If you develop nausea and vomiting that is not controlled by your nausea medication, call the clinic.   BELOW ARE SYMPTOMS THAT SHOULD BE REPORTED IMMEDIATELY:  *FEVER GREATER THAN 100.5 F  *CHILLS WITH OR WITHOUT FEVER  NAUSEA AND VOMITING THAT IS NOT CONTROLLED WITH YOUR NAUSEA MEDICATION  *UNUSUAL SHORTNESS OF BREATH  *UNUSUAL BRUISING OR BLEEDING  TENDERNESS IN MOUTH AND THROAT WITH OR WITHOUT PRESENCE OF ULCERS  *URINARY PROBLEMS  *BOWEL PROBLEMS  UNUSUAL RASH Items with * indicate a potential emergency and should be followed up as soon as possible.  Feel free to call the clinic you have any questions or concerns. The clinic phone number is [phone].  Please show the CHEMO ALERT CARD at check-in to the Emergency Department and triage nurse.   

## 2016-05-14 ENCOUNTER — Other Ambulatory Visit: Payer: Self-pay | Admitting: Oncology

## 2016-05-15 ENCOUNTER — Encounter: Payer: Self-pay | Admitting: Oncology

## 2016-05-15 ENCOUNTER — Ambulatory Visit (HOSPITAL_BASED_OUTPATIENT_CLINIC_OR_DEPARTMENT_OTHER): Payer: 59 | Admitting: Oncology

## 2016-05-15 ENCOUNTER — Telehealth: Payer: Self-pay | Admitting: Oncology

## 2016-05-15 ENCOUNTER — Other Ambulatory Visit (HOSPITAL_BASED_OUTPATIENT_CLINIC_OR_DEPARTMENT_OTHER): Payer: 59

## 2016-05-15 ENCOUNTER — Telehealth: Payer: Self-pay

## 2016-05-15 VITALS — BP 183/84 | HR 86 | Temp 98.0°F | Resp 20 | Ht 66.0 in | Wt 192.1 lb

## 2016-05-15 DIAGNOSIS — Z720 Tobacco use: Secondary | ICD-10-CM

## 2016-05-15 DIAGNOSIS — Z5111 Encounter for antineoplastic chemotherapy: Secondary | ICD-10-CM

## 2016-05-15 DIAGNOSIS — C541 Malignant neoplasm of endometrium: Secondary | ICD-10-CM | POA: Diagnosis not present

## 2016-05-15 LAB — COMPREHENSIVE METABOLIC PANEL
ALBUMIN: 4.2 g/dL (ref 3.5–5.0)
ALK PHOS: 70 U/L (ref 40–150)
ALT: 23 U/L (ref 0–55)
ANION GAP: 12 meq/L — AB (ref 3–11)
AST: 18 U/L (ref 5–34)
BILIRUBIN TOTAL: 0.86 mg/dL (ref 0.20–1.20)
BUN: 16.3 mg/dL (ref 7.0–26.0)
CALCIUM: 9.5 mg/dL (ref 8.4–10.4)
CO2: 25 mEq/L (ref 22–29)
CREATININE: 0.8 mg/dL (ref 0.6–1.1)
Chloride: 101 mEq/L (ref 98–109)
EGFR: 85 mL/min/{1.73_m2} — ABNORMAL LOW (ref >90)
Glucose: 116 mg/dl (ref 70–140)
Potassium: 3.6 mEq/L (ref 3.5–5.1)
Sodium: 138 mEq/L (ref 136–145)
TOTAL PROTEIN: 7.4 g/dL (ref 6.4–8.3)

## 2016-05-15 LAB — CBC WITH DIFFERENTIAL/PLATELET
BASO%: 0.3 % (ref 0.0–2.0)
Basophils Absolute: 0 10*3/uL (ref 0.0–0.1)
EOS%: 4.5 % (ref 0.0–7.0)
Eosinophils Absolute: 0.1 10*3/uL (ref 0.0–0.5)
HEMATOCRIT: 37.3 % (ref 34.8–46.6)
HEMOGLOBIN: 12.4 g/dL (ref 11.6–15.9)
LYMPH#: 0.4 10*3/uL — AB (ref 0.9–3.3)
LYMPH%: 15.6 % (ref 14.0–49.7)
MCH: 29 pg (ref 25.1–34.0)
MCHC: 33.4 g/dL (ref 31.5–36.0)
MCV: 87 fL (ref 79.5–101.0)
MONO#: 0 10*3/uL — AB (ref 0.1–0.9)
MONO%: 0.9 % (ref 0.0–14.0)
NEUT%: 78.7 % — AB (ref 38.4–76.8)
NEUTROS ABS: 2.2 10*3/uL (ref 1.5–6.5)
PLATELETS: 149 10*3/uL (ref 145–400)
RBC: 4.28 10*6/uL (ref 3.70–5.45)
RDW: 18.5 % — AB (ref 11.2–14.5)
WBC: 2.8 10*3/uL — ABNORMAL LOW (ref 3.9–10.3)

## 2016-05-15 NOTE — Telephone Encounter (Signed)
appt made and avs printed °

## 2016-05-15 NOTE — Telephone Encounter (Signed)
-----   Message from [place], RN sent at 05/11/2016  4:54 PM EDT ----- Regarding: [place]-chemo f/u call First time 3 hr taxol and carboplatin-no difficulties.

## 2016-05-15 NOTE — Progress Notes (Signed)
OFFICE PROGRESS NOTE   May 16, 2016   Physicians: Charlene Brooke, Nickola Major, DO, Gery Pray, Marylynn Pearson, (M.Wert)  INTERVAL HISTORY:  Patient is seen, alone for visit, in continuing attention to adjuvant therapy continuing for IIIC1 high grade endometrial carcinoma. She had IMRT with CDDP at beginning and completion, 3 vaginal brachytherapy treatments thru 05-08-16 and first of four planned carboplatin taxol chemotherapy treatments on 05-11-16.   Patient had no difficulty with the first carbo taxol infusion, with full steroid premedication. She noticed taxol in aches beginning evening of day 2, which have continued somewhat intermittently thru today (day 5 cycle 1), bothersome but not severe.  Tramadol was not helpful, ibuprofen only briefly helpful, heat not helpful; she is using claritin. She has had no nausea, bowels are moving without diarrhea and she is eating tho likely not drinking fluids well enough. She has felt just a little lightheaded at times when up. Peripheral IV access was not difficult. She denies abdominal or pelvic pain, SOB or cough, LE swelling, bleeding, fever. No peripheral neuropathy. Remainder of 10 point review of systems negative.   No central catheter MSI high. Genetics counseling scheduled for 05-09-16  ONCOLOGIC HISTORY Patient had postmenopausal spotting 09-2014, with gyn exam and PAP then negative. She had 3 days of spotting in Sept 2016, then slightly heavier spotting in 12-2015. US showed thickened endometrial stripe with normal size uterus, and 1.3 cm dermoid on left ovary. Hysteroscopy D&C 12-27-15 found grade 1 endometrial carcinoma. She was referred to Dr Denman George, with robotic assisted total hysterectomy with BSO and sentinel node evaluation done 01-19-16. Pathology [phone]) found grade 3 endometrial adenocarcinoma (poorly differentiated arising from well differentiated adenocarcinoma), 7.2 cm tumor invading into outer half of myometrium with benign  cervix, tubes, ovaries. No definite LVSI. 1 of 8 right pelvic nodes with isolated tumor cell, 1 left pelvic node + in addition to 1 left pelvic node with isolated tumor cells and 4 left pelvic nodes negative. Patient was DC home within 24 hours, no prophylactic lovenox at DC.  GIven grade 3 pathology, she had CT CAP 02-02-16 on 02-02-16, with no clear distant disease and no obvious residual pelvic disease. Recommendation is for pelvic radiation with CDDP days 1 and 28, then 4 cycles of carboplatin taxol chemotherapy. First CDDP was given 03-05-16 and second on 04-05-16. She had first carbo taxol on 05-11-16.    Objective:  Vital signs in last 24 hours:  BP (!) 183/84 (BP Location: Left Arm, Patient Position: Sitting) Comment: informed nurse  Pulse 86   Temp 98 F (36.7 C) (Oral)   Resp 20   Ht 5' 6"  (1.676 m)   Wt 192 lb 1.6 oz (87.1 kg)   SpO2 100%   BMI 31.01 kg/m   weight down 4 lbs. Alert, oriented and appropriate. Ambulatory without difficulty.  No alopecia  HEENT:PERRL, sclerae not icteric. Oral mucosa a little dry without lesions, posterior pharynx clear.  Neck supple. No JVD.  Lymphatics:no cervical,supraclavicular or inguinal adenopathy Resp: clear to auscultation bilaterally and normal percussion bilaterally Cardio: regular rate and rhythm. No gallop. GI: soft, nontender, not distended, no mass or organomegaly. Normally active bowel sounds. Surgical incisions not remarkable. Musculoskeletal/ Extremities: LE without pitting edema, cords, tenderness, joint effusions. Neuro: no peripheral neuropathy. Otherwise nonfocal. PSYCH appropriate mood and affect Skin without rash, ecchymosis, petechiae   Lab Results:  Results for orders placed or performed in visit on 05/15/16  CBC with Differential  Result Value Ref Range   WBC  2.8 (L) 3.9 - 10.3 10e3/uL   NEUT# 2.2 1.5 - 6.5 10e3/uL   HGB 12.4 11.6 - 15.9 g/dL   HCT 37.3 34.8 - 46.6 %   Platelets 149 145 - 400 10e3/uL   MCV  87.0 79.5 - 101.0 fL   MCH 29.0 25.1 - 34.0 pg   MCHC 33.4 31.5 - 36.0 g/dL   RBC 4.28 3.70 - 5.45 10e6/uL   RDW 18.5 (H) 11.2 - 14.5 %   lymph# 0.4 (L) 0.9 - 3.3 10e3/uL   MONO# 0.0 (L) 0.1 - 0.9 10e3/uL   Eosinophils Absolute 0.1 0.0 - 0.5 10e3/uL   Basophils Absolute 0.0 0.0 - 0.1 10e3/uL   NEUT% 78.7 (H) 38.4 - 76.8 %   LYMPH% 15.6 14.0 - 49.7 %   MONO% 0.9 0.0 - 14.0 %   EOS% 4.5 0.0 - 7.0 %   BASO% 0.3 0.0 - 2.0 %  Comprehensive metabolic panel  Result Value Ref Range   Sodium 138 136 - 145 mEq/L   Potassium 3.6 3.5 - 5.1 mEq/L   Chloride 101 98 - 109 mEq/L   CO2 25 22 - 29 mEq/L   Glucose 116 70 - 140 mg/dl   BUN 16.3 7.0 - 26.0 mg/dL   Creatinine 0.8 0.6 - 1.1 mg/dL   Total Bilirubin 0.86 0.20 - 1.20 mg/dL   Alkaline Phosphatase 70 40 - 150 U/L   AST 18 5 - 34 U/L   ALT 23 0 - 55 U/L   Total Protein 7.4 6.4 - 8.3 g/dL   Albumin 4.2 3.5 - 5.0 g/dL   Calcium 9.5 8.4 - 10.4 mg/dL   Anion Gap 12 (H) 3 - 11 mEq/L   EGFR 85 (L) >90 ml/min/1.73 m2     Studies/Results:  No results found.  Medications: I have reviewed the patient's current medications. Refill decadron for #20 with 2 RF, for last planned 3 cycles chemo.   DISCUSSION Taxol aches discussed, ok to continue ibuprofen and claritin and can try ice packs, but hopefully will resolve soon.  Needs to increase oral hydration, discussed   Counts expected to decrease over next 7-10 days from first carbo taxol on 05-11-16. 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  WIll recheck CBC in AM 9-7, would give granix 480 on 9-7 if [place] <2.0. Message sent to managed care for preauth granix , as still does not show approved in EMR.   Assessment/Plan:  1.IIIC1 poorly differentiated endometrial adenocarcinoma arising in well differentiated endometrial adenocarcinoma: post complete debulking by Dr Denman George 01-19-16, pelvic radiation with CDDP x2, HDR and first of  planned four cycles carbo taxol given 05-11-16, tolerated well other than moderate taxol aches thus far, counts not at nadir.  As she hopes to go out of town 9-7 thru 9-10, will repeat CBC AM 9-7/ granix per parameters above.     I will see her with labs 9-14 and she will have cycle 2 carbo taxol on 06-01-16 as long as [place] >=1.5 and plt >=100k by day of treatment. Genetics testing recommended for high MSI, counseling scheduled 05-09-16. 2 radiation diarrhea resolved 3.long minimal tobacco abuse: counseled for tobacco cessation, will continue to address 4.overdue mammograms which we will try to address at least after completion of treatment. 5.post cholecystectomy and lumbar disc surgery x2 6.no colonoscopy, which will also be addressed as possible 7.environmental allergies 8.benign arrhythmias on Holter 2014   All  questions answered and patient is comfortable with plans as above. TIme spent 25 min including >50% counseling and coordination of care. Route PCP   Evlyn Clines, MD   05/16/2016, 4:08 PM

## 2016-05-15 NOTE — Telephone Encounter (Signed)
lvm chemo follow up call. Watching for adequate food and water intake, nausea, vomiting, constipation, diarrhea, body aches etc. Call us if any questions or concerns.

## 2016-05-16 ENCOUNTER — Other Ambulatory Visit: Payer: Self-pay

## 2016-05-16 DIAGNOSIS — C541 Malignant neoplasm of endometrium: Secondary | ICD-10-CM

## 2016-05-16 MED ORDER — DEXAMETHASONE 4 MG PO TABS: ORAL_TABLET | ORAL | 2 refills | Status: DC

## 2016-05-16 NOTE — Telephone Encounter (Signed)
Message  Received: Today  Message Contents  Gordy Levan, MD  Baruch Merl, RN        Please refill decadron 4 mg Five tabs with food 12 hrs and 6 hrs prior to chemo #10 with 2 RF for last 3 cycles chemo   thanks

## 2016-05-17 ENCOUNTER — Ambulatory Visit (HOSPITAL_BASED_OUTPATIENT_CLINIC_OR_DEPARTMENT_OTHER): Payer: 59

## 2016-05-17 ENCOUNTER — Telehealth: Payer: Self-pay | Admitting: Oncology

## 2016-05-17 ENCOUNTER — Other Ambulatory Visit (HOSPITAL_BASED_OUTPATIENT_CLINIC_OR_DEPARTMENT_OTHER): Payer: 59

## 2016-05-17 ENCOUNTER — Other Ambulatory Visit: Payer: Self-pay | Admitting: Oncology

## 2016-05-17 VITALS — BP 168/82 | HR 82 | Temp 98.0°F | Resp 20

## 2016-05-17 DIAGNOSIS — C541 Malignant neoplasm of endometrium: Secondary | ICD-10-CM | POA: Diagnosis not present

## 2016-05-17 DIAGNOSIS — Z5189 Encounter for other specified aftercare: Secondary | ICD-10-CM

## 2016-05-17 LAB — CBC WITH DIFFERENTIAL/PLATELET
BASO%: 0.8 % (ref 0.0–2.0)
BASOS ABS: 0 10*3/uL (ref 0.0–0.1)
EOS ABS: 0.2 10*3/uL (ref 0.0–0.5)
EOS%: 10.1 % — ABNORMAL HIGH (ref 0.0–7.0)
HEMATOCRIT: 33.2 % — AB (ref 34.8–46.6)
HGB: 11.2 g/dL — ABNORMAL LOW (ref 11.6–15.9)
LYMPH#: 0.4 10*3/uL — AB (ref 0.9–3.3)
LYMPH%: 19.8 % (ref 14.0–49.7)
MCH: 29.4 pg (ref 25.1–34.0)
MCHC: 33.7 g/dL (ref 31.5–36.0)
MCV: 87.2 fL (ref 79.5–101.0)
MONO#: 0.1 10*3/uL (ref 0.1–0.9)
MONO%: 3.2 % (ref 0.0–14.0)
NEUT#: 1.2 10*3/uL — ABNORMAL LOW (ref 1.5–6.5)
NEUT%: 66.1 % (ref 38.4–76.8)
PLATELETS: 135 10*3/uL — AB (ref 145–400)
RBC: 3.81 10*6/uL (ref 3.70–5.45)
RDW: 17.7 % — ABNORMAL HIGH (ref 11.2–14.5)
WBC: 1.9 10*3/uL — ABNORMAL LOW (ref 3.9–10.3)

## 2016-05-17 MED ORDER — TBO-FILGRASTIM 480 MCG/0.8ML ~~LOC~~ SOSY
480.0000 ug | PREFILLED_SYRINGE | Freq: Once | SUBCUTANEOUS | Status: AC
  Administered 2016-05-17: 480 ug via SUBCUTANEOUS
  Filled 2016-05-17: qty 0.8

## 2016-05-17 NOTE — Patient Instructions (Signed)

## 2016-05-17 NOTE — Telephone Encounter (Signed)
lvm to inform pt of 9/11 appts per LOS

## 2016-05-21 ENCOUNTER — Other Ambulatory Visit: Payer: Self-pay

## 2016-05-21 ENCOUNTER — Other Ambulatory Visit (HOSPITAL_BASED_OUTPATIENT_CLINIC_OR_DEPARTMENT_OTHER): Payer: 59

## 2016-05-21 ENCOUNTER — Ambulatory Visit (HOSPITAL_BASED_OUTPATIENT_CLINIC_OR_DEPARTMENT_OTHER): Payer: 59

## 2016-05-21 ENCOUNTER — Other Ambulatory Visit: Payer: Self-pay | Admitting: Oncology

## 2016-05-21 VITALS — BP 170/65 | HR 94 | Temp 98.6°F | Resp 18

## 2016-05-21 DIAGNOSIS — Z5189 Encounter for other specified aftercare: Secondary | ICD-10-CM

## 2016-05-21 DIAGNOSIS — D701 Agranulocytosis secondary to cancer chemotherapy: Secondary | ICD-10-CM

## 2016-05-21 DIAGNOSIS — T451X5A Adverse effect of antineoplastic and immunosuppressive drugs, initial encounter: Principal | ICD-10-CM

## 2016-05-21 DIAGNOSIS — C541 Malignant neoplasm of endometrium: Secondary | ICD-10-CM | POA: Diagnosis not present

## 2016-05-21 LAB — CBC WITH DIFFERENTIAL/PLATELET
BASO%: 2.1 % — AB (ref 0.0–2.0)
BASOS ABS: 0 10*3/uL (ref 0.0–0.1)
EOS%: 6.4 % (ref 0.0–7.0)
Eosinophils Absolute: 0.1 10*3/uL (ref 0.0–0.5)
HCT: 33.4 % — ABNORMAL LOW (ref 34.8–46.6)
HEMOGLOBIN: 11.6 g/dL (ref 11.6–15.9)
LYMPH#: 0.5 10*3/uL — AB (ref 0.9–3.3)
LYMPH%: 37.6 % (ref 14.0–49.7)
MCH: 30.1 pg (ref 25.1–34.0)
MCHC: 34.7 g/dL (ref 31.5–36.0)
MCV: 86.5 fL (ref 79.5–101.0)
MONO#: 0.5 10*3/uL (ref 0.1–0.9)
MONO%: 36.9 % — ABNORMAL HIGH (ref 0.0–14.0)
NEUT%: 17 % — ABNORMAL LOW (ref 38.4–76.8)
NEUTROS ABS: 0.2 10*3/uL — AB (ref 1.5–6.5)
NRBC: 3 % — AB (ref 0–0)
Platelets: 131 10*3/uL — ABNORMAL LOW (ref 145–400)
RBC: 3.86 10*6/uL (ref 3.70–5.45)
RDW: 16.1 % — AB (ref 11.2–14.5)
WBC: 1.4 10*3/uL — AB (ref 3.9–10.3)

## 2016-05-21 LAB — COMPREHENSIVE METABOLIC PANEL
ALBUMIN: 4.1 g/dL (ref 3.5–5.0)
ALK PHOS: 81 U/L (ref 40–150)
ALT: 24 U/L (ref 0–55)
AST: 15 U/L (ref 5–34)
Anion Gap: 12 mEq/L — ABNORMAL HIGH (ref 3–11)
BILIRUBIN TOTAL: 0.48 mg/dL (ref 0.20–1.20)
BUN: 19 mg/dL (ref 7.0–26.0)
CO2: 21 meq/L — AB (ref 22–29)
CREATININE: 0.8 mg/dL (ref 0.6–1.1)
Calcium: 9.5 mg/dL (ref 8.4–10.4)
Chloride: 105 mEq/L (ref 98–109)
EGFR: 76 mL/min/{1.73_m2} — ABNORMAL LOW (ref >90)
GLUCOSE: 123 mg/dL (ref 70–140)
Potassium: 3.9 mEq/L (ref 3.5–5.1)
SODIUM: 138 meq/L (ref 136–145)
TOTAL PROTEIN: 7.5 g/dL (ref 6.4–8.3)

## 2016-05-21 MED ORDER — CIPROFLOXACIN HCL 250 MG PO TABS
250.0000 mg | ORAL_TABLET | Freq: Two times a day (BID) | ORAL | 0 refills | Status: DC
Start: 2016-05-21 — End: 2016-05-24

## 2016-05-21 MED ORDER — TBO-FILGRASTIM 480 MCG/0.8ML ~~LOC~~ SOSY
480.0000 ug | PREFILLED_SYRINGE | Freq: Once | SUBCUTANEOUS | Status: AC
  Administered 2016-05-21: 480 ug via SUBCUTANEOUS
  Filled 2016-05-21: qty 0.8

## 2016-05-21 NOTE — Patient Instructions (Signed)

## 2016-05-22 ENCOUNTER — Other Ambulatory Visit: Payer: Self-pay | Admitting: Oncology

## 2016-05-22 ENCOUNTER — Ambulatory Visit (HOSPITAL_BASED_OUTPATIENT_CLINIC_OR_DEPARTMENT_OTHER): Payer: 59

## 2016-05-22 VITALS — BP 160/85 | HR 88 | Temp 98.9°F | Resp 20

## 2016-05-22 DIAGNOSIS — C541 Malignant neoplasm of endometrium: Secondary | ICD-10-CM | POA: Diagnosis not present

## 2016-05-22 DIAGNOSIS — Z5189 Encounter for other specified aftercare: Secondary | ICD-10-CM | POA: Diagnosis not present

## 2016-05-22 DIAGNOSIS — D701 Agranulocytosis secondary to cancer chemotherapy: Secondary | ICD-10-CM

## 2016-05-22 DIAGNOSIS — T451X5A Adverse effect of antineoplastic and immunosuppressive drugs, initial encounter: Secondary | ICD-10-CM

## 2016-05-22 MED ORDER — TBO-FILGRASTIM 480 MCG/0.8ML ~~LOC~~ SOSY
480.0000 ug | PREFILLED_SYRINGE | Freq: Once | SUBCUTANEOUS | Status: AC
  Administered 2016-05-22: 480 ug via SUBCUTANEOUS
  Filled 2016-05-22: qty 0.8

## 2016-05-22 NOTE — Patient Instructions (Signed)

## 2016-05-23 ENCOUNTER — Ambulatory Visit: Payer: 59

## 2016-05-23 ENCOUNTER — Other Ambulatory Visit: Payer: Self-pay | Admitting: Oncology

## 2016-05-23 ENCOUNTER — Other Ambulatory Visit (HOSPITAL_BASED_OUTPATIENT_CLINIC_OR_DEPARTMENT_OTHER): Payer: 59

## 2016-05-23 DIAGNOSIS — C541 Malignant neoplasm of endometrium: Secondary | ICD-10-CM | POA: Diagnosis not present

## 2016-05-23 DIAGNOSIS — D701 Agranulocytosis secondary to cancer chemotherapy: Secondary | ICD-10-CM

## 2016-05-23 DIAGNOSIS — T451X5A Adverse effect of antineoplastic and immunosuppressive drugs, initial encounter: Secondary | ICD-10-CM

## 2016-05-23 LAB — CBC WITH DIFFERENTIAL/PLATELET
BASO%: 0.7 % (ref 0.0–2.0)
BASOS ABS: 0.1 10*3/uL (ref 0.0–0.1)
EOS%: 2.1 % (ref 0.0–7.0)
Eosinophils Absolute: 0.3 10*3/uL (ref 0.0–0.5)
HEMATOCRIT: 30.9 % — AB (ref 34.8–46.6)
HGB: 10.4 g/dL — ABNORMAL LOW (ref 11.6–15.9)
LYMPH#: 0.8 10*3/uL — AB (ref 0.9–3.3)
LYMPH%: 5.3 % — AB (ref 14.0–49.7)
MCH: 29.8 pg (ref 25.1–34.0)
MCHC: 33.7 g/dL (ref 31.5–36.0)
MCV: 88.5 fL (ref 79.5–101.0)
MONO#: 1.1 10*3/uL — ABNORMAL HIGH (ref 0.1–0.9)
MONO%: 7.3 % (ref 0.0–14.0)
NEUT#: 12.6 10*3/uL — ABNORMAL HIGH (ref 1.5–6.5)
NEUT%: 84.6 % — AB (ref 38.4–76.8)
PLATELETS: 113 10*3/uL — AB (ref 145–400)
RBC: 3.49 10*6/uL — ABNORMAL LOW (ref 3.70–5.45)
RDW: 17.6 % — ABNORMAL HIGH (ref 11.2–14.5)
WBC: 14.9 10*3/uL — ABNORMAL HIGH (ref 3.9–10.3)

## 2016-05-23 NOTE — Progress Notes (Signed)
Pt denies fevers or chills. ABS Neutrophils noted to be 12.6. Pt instructed to discountinue  cipro by mouth, and to keep appointment for tomorrow. Pt verbalized directions

## 2016-05-24 ENCOUNTER — Encounter: Payer: Self-pay | Admitting: Oncology

## 2016-05-24 ENCOUNTER — Ambulatory Visit (HOSPITAL_BASED_OUTPATIENT_CLINIC_OR_DEPARTMENT_OTHER): Payer: 59 | Admitting: Oncology

## 2016-05-24 ENCOUNTER — Other Ambulatory Visit (HOSPITAL_BASED_OUTPATIENT_CLINIC_OR_DEPARTMENT_OTHER): Payer: 59

## 2016-05-24 VITALS — BP 153/65 | HR 85 | Temp 98.5°F | Resp 16 | Ht 66.0 in | Wt 195.2 lb

## 2016-05-24 DIAGNOSIS — D701 Agranulocytosis secondary to cancer chemotherapy: Secondary | ICD-10-CM | POA: Diagnosis not present

## 2016-05-24 DIAGNOSIS — Z5111 Encounter for antineoplastic chemotherapy: Secondary | ICD-10-CM

## 2016-05-24 DIAGNOSIS — C541 Malignant neoplasm of endometrium: Secondary | ICD-10-CM | POA: Diagnosis not present

## 2016-05-24 DIAGNOSIS — T451X5A Adverse effect of antineoplastic and immunosuppressive drugs, initial encounter: Secondary | ICD-10-CM

## 2016-05-24 LAB — CBC WITH DIFFERENTIAL/PLATELET
BASO%: 0.6 % (ref 0.0–2.0)
BASOS ABS: 0 10*3/uL (ref 0.0–0.1)
EOS ABS: 0.2 10*3/uL (ref 0.0–0.5)
EOS%: 3.3 % (ref 0.0–7.0)
HCT: 30.3 % — ABNORMAL LOW (ref 34.8–46.6)
HEMOGLOBIN: 10.6 g/dL — AB (ref 11.6–15.9)
LYMPH#: 0.8 10*3/uL — AB (ref 0.9–3.3)
LYMPH%: 11.2 % — ABNORMAL LOW (ref 14.0–49.7)
MCH: 30.8 pg (ref 25.1–34.0)
MCHC: 35 g/dL (ref 31.5–36.0)
MCV: 88.1 fL (ref 79.5–101.0)
MONO#: 0.6 10*3/uL (ref 0.1–0.9)
MONO%: 8.8 % (ref 0.0–14.0)
NEUT%: 76.1 % (ref 38.4–76.8)
NEUTROS ABS: 5.4 10*3/uL (ref 1.5–6.5)
NRBC: 1 % — AB (ref 0–0)
PLATELETS: 113 10*3/uL — AB (ref 145–400)
RBC: 3.44 10*6/uL — ABNORMAL LOW (ref 3.70–5.45)
RDW: 16.8 % — AB (ref 11.2–14.5)
WBC: 7 10*3/uL (ref 3.9–10.3)

## 2016-05-24 NOTE — Progress Notes (Signed)
OFFICE PROGRESS NOTE   May 24, 2016   Physicians: Charlene Brooke, Nickola Major, DO, Gery Pray, Marylynn Pearson, (M.Wert)  INTERVAL HISTORY:  Patient is seen, alone for visit, in continuing attention to adjuvant therapy continuing for IIIC1 high grade endometrial carcinoma. She completed IMRT with CDDP at initiation and completion, and vaginal brachytherapy, and had first of planned 4 cycles of carboplatin taxol on 05-11-16.  She had one dose of granix on day 7 cycle 1, then went out of [place]; she was neutropenic with ANC 0.2 when she returned on day 11. Fortunately she had no infectious complications and counts recovered with 2 additional doses of granix.   Patient enjoyed her trip, avoiding crowds, concert etc. She did not feel badly when neutropenic and has felt well this week. Tylenol PM helped aching in legs after first taxol. Appetite is good, no nausea, no problems with bowels, no SOB, no bleeding, no LE swelling, no peripheral neuropathy. She is beginning to lose hair.  Sleeps until 0200, up for about 2 hrs then back asleep, chronic pattern x years. Remainder of 10 point Review of Systems negative.  No central catheter MSI high. Genetics counseling scheduled for 06-13-16    ONCOLOGIC HISTORY Patient had postmenopausal spotting 09-2014, with gyn exam and PAP then negative. She had 3 days of spotting in Sept 2016, then slightly heavier spotting in 12-2015. US showed thickened endometrial stripe with normal size uterus, and 1.3 cm dermoid on left ovary. Hysteroscopy D&C 12-27-15 found grade 1 endometrial carcinoma. She was referred to Dr Denman George, with robotic assisted total hysterectomy with BSO and sentinel node evaluation done 01-19-16. Pathology [phone]) found grade 3 endometrial adenocarcinoma (poorly differentiated arising from well differentiated adenocarcinoma), 7.2 cm tumor invading into outer half of myometrium with benign cervix, tubes, ovaries. No definite LVSI. 1 of 8 right  pelvic nodes with isolated tumor cell, 1 left pelvic node + in addition to 1 left pelvic node with isolated tumor cells and 4 left pelvic nodes negative. Patient was DC home within 24 hours, no prophylactic lovenox at DC.  GIven grade 3 pathology, she had CT CAP 02-02-16 on 02-02-16, with no clear distant disease and no obvious residual pelvic disease. Recommendation is for pelvic radiation with CDDP days 1 and 28, then 4 cycles of carboplatin taxol chemotherapy. First CDDP was given 03-05-16 and second on 04-05-16. She had first carbo taxol on 05-11-16, neutropenic with ANC 0.2 by day 11 cycle 1.   Objective:  Vital signs in last 24 hours:  BP (!) 153/65 (BP Location: Right Arm, Patient Position: Sitting)   Pulse 85   Temp 98.5 F (36.9 C) (Oral)   Resp 16   Ht 5' 6"  (1.676 m)   Wt 195 lb 4 oz (88.6 kg)   SpO2 100%   BMI 31.51 kg/m  Weight up 3 lbs. Looks comfortable, very pleasant and in good spirits. Alert, oriented and appropriate. Ambulatory without difficulty.  No alopecia  HEENT:PERRL, sclerae not icteric. Oral mucosa moist without lesions, posterior pharynx clear.  Neck supple. No JVD.  Lymphatics:no cervical,supraclavicular adenopathy Resp: clear to auscultation bilaterally and normal percussion bilaterally Cardio: regular rate and rhythm. No gallop. GI: soft, nontender, not distended, no mass or organomegaly. Normally active bowel sounds. Surgical incisions not remarkable. Musculoskeletal/ Extremities: without pitting edema, cords, tenderness Neuro: no peripheral neuropathy. Otherwise nonfocal. PSYCH appropriate mood and affect Skin without rash, ecchymosis, petechiae   Lab Results:  Results for orders placed or performed in visit on 05/24/16  CBC with Differential  Result Value Ref Range   WBC 7.0 3.9 - 10.3 10e3/uL   NEUT# 5.4 1.5 - 6.5 10e3/uL   HGB 10.6 (L) 11.6 - 15.9 g/dL   HCT 30.3 (L) 34.8 - 46.6 %   Platelets 113 (L) 145 - 400 10e3/uL   MCV 88.1 79.5 - 101.0  fL   MCH 30.8 25.1 - 34.0 pg   MCHC 35.0 31.5 - 36.0 g/dL   RBC 3.44 (L) 3.70 - 5.45 10e6/uL   RDW 16.8 (H) 11.2 - 14.5 %   lymph# 0.8 (L) 0.9 - 3.3 10e3/uL   MONO# 0.6 0.1 - 0.9 10e3/uL   Eosinophils Absolute 0.2 0.0 - 0.5 10e3/uL   Basophils Absolute 0.0 0.0 - 0.1 10e3/uL   NEUT% 76.1 38.4 - 76.8 %   LYMPH% 11.2 (L) 14.0 - 49.7 %   MONO% 8.8 0.0 - 14.0 %   EOS% 3.3 0.0 - 7.0 %   BASO% 0.6 0.0 - 2.0 %   nRBC 1 (H) 0 - 0 %   CBC repeated today due to platelets lower on 9-11, stable now so expect past nadir CMET 05-21-16  Ok  Studies/Results:  No results found.  Medications: I have reviewed the patient's current medications. WIll use granix 480 mcg x3 after subsequent taxol carbo cycles, begin day 6 to avoid taxol aches. Decadron premedication 20 mg 12 hrs and 6 hrs prior to taxol   DISCUSSION Interval history reviewed as above.  Discussed granix as above.  Patient in agreement with continuing treatment as planned, cycle 2 on 06-01-16 as long as [place] >=1.5 and plt >=100k , granix 9-27,28,29  Assessment/Plan:  1.IIIC1 poorly differentiated endometrial adenocarcinoma arising in well differentiated endometrial adenocarcinoma: post complete debulking by Dr Denman George 01-19-16, pelvic radiation with CDDP x2, HDR and first of planned four cycles carbo taxol given 05-11-16, tolerated well other than neutropenia by day 11 and moderate taxol aches.   She will have cycle 2 carbo taxol on 06-01-16 as long as [place] >=1.5 and plt >=100k by day of treatment. Granix as noted. I will see her with labs 10-2, then 10-12 prior to cycle 3 on 10-13. Genetics testing recommended for high MSI, counseling scheduled 06-13-16. 2 radiation diarrhea resolved 3.long minimal tobacco abuse: counseled for tobacco cessation, will continue to address 4.overdue mammograms which we will try to address at least after completion of treatment. 5.post cholecystectomy and lumbar disc surgery x2 6.no colonoscopy, which will also be  addressed as possible 7.environmental allergies 8.benign arrhythmias on Holter 2014   All questions answered and patient is in agreement with recommendations and plans. Chemo and granix orders placed. Time spent 25 min including >50% counseling and coordination of care. Route PCP.   Evlyn Clines, MD   05/24/2016, 12:48 PM

## 2016-05-29 ENCOUNTER — Telehealth: Payer: Self-pay

## 2016-05-29 ENCOUNTER — Telehealth: Payer: Self-pay | Admitting: *Deleted

## 2016-05-29 NOTE — Telephone Encounter (Addendum)
Sent POF to scheduling to R/S Injection appointments from 10-26;27;28 to 9-30;10-2;10-3.

## 2016-05-29 NOTE — Telephone Encounter (Signed)
Ms Behner son is having surgery Friday 06-01-16. Told her that the only available day to move chemotherapy is 06-05-16.  She is fine with this date.   Told her that the Injection appointments would need to be r/s as well . Reminded her to take the decadron 12 and 6 hrs prior to treatment and begin claritin the day of chemotherapy for ~ 5 days to help with the Taxol aches. Ms Brandy verbalized understanding.

## 2016-05-29 NOTE — Telephone Encounter (Signed)
Per the staff message and desk RN ok to move appt from 9/22 to 9/26. Scheduler notified

## 2016-06-01 ENCOUNTER — Other Ambulatory Visit: Payer: 59

## 2016-06-01 ENCOUNTER — Ambulatory Visit: Payer: 59

## 2016-06-05 ENCOUNTER — Other Ambulatory Visit (HOSPITAL_BASED_OUTPATIENT_CLINIC_OR_DEPARTMENT_OTHER): Payer: 59

## 2016-06-05 ENCOUNTER — Ambulatory Visit: Payer: 59

## 2016-06-05 ENCOUNTER — Ambulatory Visit (HOSPITAL_BASED_OUTPATIENT_CLINIC_OR_DEPARTMENT_OTHER): Payer: 59

## 2016-06-05 VITALS — BP 168/88 | HR 94 | Temp 98.9°F | Resp 18

## 2016-06-05 DIAGNOSIS — C541 Malignant neoplasm of endometrium: Secondary | ICD-10-CM

## 2016-06-05 DIAGNOSIS — Z5111 Encounter for antineoplastic chemotherapy: Secondary | ICD-10-CM | POA: Diagnosis not present

## 2016-06-05 LAB — COMPREHENSIVE METABOLIC PANEL
ALT: 26 U/L (ref 0–55)
ANION GAP: 14 meq/L — AB (ref 3–11)
AST: 14 U/L (ref 5–34)
Albumin: 4 g/dL (ref 3.5–5.0)
Alkaline Phosphatase: 78 U/L (ref 40–150)
BUN: 19.7 mg/dL (ref 7.0–26.0)
CHLORIDE: 107 meq/L (ref 98–109)
CO2: 17 meq/L — AB (ref 22–29)
CREATININE: 0.8 mg/dL (ref 0.6–1.1)
Calcium: 9.6 mg/dL (ref 8.4–10.4)
EGFR: 77 mL/min/{1.73_m2} — ABNORMAL LOW (ref >90)
Glucose: 260 mg/dl — ABNORMAL HIGH (ref 70–140)
Potassium: 4 mEq/L (ref 3.5–5.1)
SODIUM: 138 meq/L (ref 136–145)
Total Bilirubin: 0.48 mg/dL (ref 0.20–1.20)
Total Protein: 7.6 g/dL (ref 6.4–8.3)

## 2016-06-05 LAB — CBC WITH DIFFERENTIAL/PLATELET
BASO%: 0.2 % (ref 0.0–2.0)
Basophils Absolute: 0 10*3/uL (ref 0.0–0.1)
EOS%: 0.1 % (ref 0.0–7.0)
Eosinophils Absolute: 0 10*3/uL (ref 0.0–0.5)
HCT: 32.9 % — ABNORMAL LOW (ref 34.8–46.6)
HGB: 10.9 g/dL — ABNORMAL LOW (ref 11.6–15.9)
LYMPH%: 5.5 % — AB (ref 14.0–49.7)
MCH: 30.6 pg (ref 25.1–34.0)
MCHC: 33.2 g/dL (ref 31.5–36.0)
MCV: 92.1 fL (ref 79.5–101.0)
MONO#: 0 10*3/uL — AB (ref 0.1–0.9)
MONO%: 0.7 % (ref 0.0–14.0)
NEUT#: 4.2 10*3/uL (ref 1.5–6.5)
NEUT%: 93.5 % — AB (ref 38.4–76.8)
PLATELETS: 161 10*3/uL (ref 145–400)
RBC: 3.57 10*6/uL — AB (ref 3.70–5.45)
RDW: 18.6 % — ABNORMAL HIGH (ref 11.2–14.5)
WBC: 4.5 10*3/uL (ref 3.9–10.3)
lymph#: 0.2 10*3/uL — ABNORMAL LOW (ref 0.9–3.3)

## 2016-06-05 MED ORDER — DIPHENHYDRAMINE HCL 50 MG/ML IJ SOLN
50.0000 mg | Freq: Once | INTRAMUSCULAR | Status: AC
  Administered 2016-06-05: 50 mg via INTRAVENOUS

## 2016-06-05 MED ORDER — SODIUM CHLORIDE 0.9 % IV SOLN
Freq: Once | INTRAVENOUS | Status: AC
  Administered 2016-06-05: 14:00:00 via INTRAVENOUS
  Filled 2016-06-05: qty 4

## 2016-06-05 MED ORDER — FAMOTIDINE IN NACL 20-0.9 MG/50ML-% IV SOLN
20.0000 mg | Freq: Once | INTRAVENOUS | Status: AC
  Administered 2016-06-05: 20 mg via INTRAVENOUS

## 2016-06-05 MED ORDER — FAMOTIDINE IN NACL 20-0.9 MG/50ML-% IV SOLN
INTRAVENOUS | Status: AC
  Filled 2016-06-05: qty 50

## 2016-06-05 MED ORDER — SODIUM CHLORIDE 0.9 % IV SOLN
Freq: Once | INTRAVENOUS | Status: AC
  Administered 2016-06-05: 14:00:00 via INTRAVENOUS
  Filled 2016-06-05: qty 5

## 2016-06-05 MED ORDER — SODIUM CHLORIDE 0.9 % IV SOLN
175.0000 mg/m2 | Freq: Once | INTRAVENOUS | Status: AC
  Administered 2016-06-05: 354 mg via INTRAVENOUS
  Filled 2016-06-05: qty 59

## 2016-06-05 MED ORDER — SODIUM CHLORIDE 0.9 % IV SOLN
641.5000 mg | Freq: Once | INTRAVENOUS | Status: AC
  Administered 2016-06-05: 640 mg via INTRAVENOUS
  Filled 2016-06-05: qty 64

## 2016-06-05 MED ORDER — DIPHENHYDRAMINE HCL 50 MG/ML IJ SOLN
INTRAMUSCULAR | Status: AC
  Filled 2016-06-05: qty 1

## 2016-06-05 MED ORDER — SODIUM CHLORIDE 0.9 % IV SOLN
Freq: Once | INTRAVENOUS | Status: AC
  Administered 2016-06-05: 13:00:00 via INTRAVENOUS

## 2016-06-05 NOTE — Patient Instructions (Signed)
[facility] Discharge Instructions for Patients Receiving Chemotherapy  Today you received the following chemotherapy agents:  Taxol and Carboplatin  To help prevent nausea and vomiting after your treatment, we encourage you to take your nausea medication as ordered per MD.   If you develop nausea and vomiting that is not controlled by your nausea medication, call the clinic.   BELOW ARE SYMPTOMS THAT SHOULD BE REPORTED IMMEDIATELY:  *FEVER GREATER THAN 100.5 F  *CHILLS WITH OR WITHOUT FEVER  NAUSEA AND VOMITING THAT IS NOT CONTROLLED WITH YOUR NAUSEA MEDICATION  *UNUSUAL SHORTNESS OF BREATH  *UNUSUAL BRUISING OR BLEEDING  TENDERNESS IN MOUTH AND THROAT WITH OR WITHOUT PRESENCE OF ULCERS  *URINARY PROBLEMS  *BOWEL PROBLEMS  UNUSUAL RASH Items with * indicate a potential emergency and should be followed up as soon as possible.  Feel free to call the clinic you have any questions or concerns. The clinic phone number is [phone].  Please show the CHEMO ALERT CARD at check-in to the Emergency Department and triage nurse.   

## 2016-06-06 ENCOUNTER — Ambulatory Visit: Payer: 59

## 2016-06-06 NOTE — Progress Notes (Signed)
  Radiation Oncology         [phone]) [phone] ________________________________  Name: Amy Holmes MRN: [id number]  Date: 05/08/2016  DOB: 1955/11/25  End of Treatment Note  Diagnosis: Stage IIIC1 high grade endometrioid adenocarcinoma  Indication for treatment: With chemotherapy to reduce the chance of locoregional recurrence s/p hysterectomy with BSO.  Radiation treatment dates:   03/01/16 - 05/08/16  Site/dose:  1) Pelvis: 45 Gy in 25 fractions  2) Vaginal Cuff: 18 Gy in 3 fractions  Beams/energy:  1) IMRT // 6X Photon   2) HDR-Vaginal Brachytherapy with Iridium 192 as the HDR source. 3 cm diameter segmented cylinder and the treatment length was 3 cm.  Narrative: The patient tolerated radiation treatment relatively well. Main complaints were occasional dysuria and diarrhea.  Plan: The patient has completed radiation treatment. The patient will return to radiation oncology clinic for routine followup in one month. I advised them to call or return sooner if they have any questions or concerns related to their recovery or treatment.  -----------------------------------  Blair Promise, PhD, MD  This document serves as a record of services personally performed by Gery Pray, MD. It was created on his behalf by Darcus Austin, a trained medical scribe. The creation of this record is based on the scribe's personal observations and the provider's statements to them. This document has been checked and approved by the attending provider.

## 2016-06-07 ENCOUNTER — Ambulatory Visit: Payer: 59

## 2016-06-09 ENCOUNTER — Ambulatory Visit (HOSPITAL_BASED_OUTPATIENT_CLINIC_OR_DEPARTMENT_OTHER): Payer: 59

## 2016-06-09 ENCOUNTER — Encounter: Payer: Self-pay | Admitting: Family Medicine

## 2016-06-09 ENCOUNTER — Ambulatory Visit (INDEPENDENT_AMBULATORY_CARE_PROVIDER_SITE_OTHER): Payer: 59 | Admitting: Family Medicine

## 2016-06-09 VITALS — BP 158/82 | HR 96 | Temp 98.8°F | Resp 16 | Wt 196.0 lb

## 2016-06-09 VITALS — BP 148/102 | HR 97 | Temp 98.8°F | Resp 18

## 2016-06-09 DIAGNOSIS — C541 Malignant neoplasm of endometrium: Secondary | ICD-10-CM | POA: Diagnosis not present

## 2016-06-09 DIAGNOSIS — I10 Essential (primary) hypertension: Secondary | ICD-10-CM

## 2016-06-09 DIAGNOSIS — D701 Agranulocytosis secondary to cancer chemotherapy: Secondary | ICD-10-CM

## 2016-06-09 HISTORY — DX: Essential (primary) hypertension: I10

## 2016-06-09 MED ORDER — HYDROCHLOROTHIAZIDE 12.5 MG PO TABS: 12.5000 mg | ORAL_TABLET | Freq: Every day | ORAL | 3 refills | Status: DC

## 2016-06-09 MED ORDER — TBO-FILGRASTIM 480 MCG/0.8ML ~~LOC~~ SOSY
480.0000 ug | PREFILLED_SYRINGE | Freq: Once | SUBCUTANEOUS | Status: AC
  Administered 2016-06-09: 480 ug via SUBCUTANEOUS

## 2016-06-09 NOTE — Progress Notes (Signed)
Pre visit review using our clinic review tool, if applicable. No additional management support is needed unless otherwise documented below in the visit note. 

## 2016-06-09 NOTE — Progress Notes (Signed)
[id number] - Pt arrival at clinic for injection.  Blood pressure high - taken again on opposite arm.  See Epic.  Pt states "it's always around 160-180".  Pt reports she is not on any BP medications.   I advised patient to contact her PCP - that she may need to be on medication to control her blood pressure.    I discussed with patient that we often see blood pressure rise when patients come to the cancer center just due to the fact that they are coming to a cancer center.  I also let her know the many patients experience white coat syndrome.    I advised patient she should check her blood pressure at home daily at approximately the same time and keep a log to take to her PCP.     Patient verbalized understanding.    BP checked again prior to discharge.

## 2016-06-09 NOTE — Progress Notes (Signed)
Dr. Frederico Hamman T. Aliz Meritt, MD, [facility] [place], [address] Phone: [phone] Fax: [phone]  06/09/2016  Patient: Amy Holmes, MRN: [id number], DOB: [birth year], 60 y.o.  Primary Physician:  Lucretia Kern., DO   No chief complaint on file.  Subjective:   Amy Holmes is a 60 y.o. very pleasant female patient who presents with the following:  Undergoing chemo and radiation now. For endometrial cancer.  Oncology nursing suggested that she come by to have blood pressure evaluation. Rads is over.  4 chemo treatment - just finished the 2nd one.   BP Readings from Last 3 Encounters:  06/09/16 (!) 158/82  06/09/16 (!) 148/102  06/05/16 (!) 168/88    Persistently high BP's. She is currently asymptomatic  Past Medical History, Surgical History, Social History, Family History, Problem List, Medications, and Allergies have been reviewed and updated if relevant.  Patient Active Problem List   Diagnosis Date Noted  . Chemotherapy induced neutropenia ([place]) 05/26/2016  . Encounter for antineoplastic chemotherapy 05/10/2016  . Dehydration 04/10/2016  . Diarrhea 04/10/2016  . Environmental allergies 02/08/2016  . Continuous tobacco abuse 02/08/2016  . International Federation of Gynecology and Obstetrics (FIGO) stage IIIC1 malignant neoplasm of endometrium ([place]) 02/08/2016  . Status post cholecystectomy 02/08/2016  . Endometrial cancer ([place]) 01/02/2016  . Cough 11/09/2014  . Smoker 10/09/2014    Past Medical History:  Diagnosis Date  . [id number] 12/2015   endometrial  . Anxiety and depression    situational  . Insomnia   . Obesity   . Palpitations    2 months ago, stopped with energy drinks stopped  . Tobacco abuse     Past Surgical History:  Procedure Laterality Date  . CHOLECYSTECTOMY  2007  . LUMBAR DISC SURGERY     x 2  . LYMPH NODE DISSECTION N/A 01/19/2016   Procedure: sentienal LYMPH NODE mapping,  bilateral pelvic lymph node dissection;  Surgeon: Everitt Amber, MD;  Location: [facility];  Service: Gynecology;  Laterality: N/A;  . ROBOTIC ASSISTED TOTAL HYSTERECTOMY WITH BILATERAL SALPINGO OOPHERECTOMY N/A 01/19/2016   Procedure: XI ROBOTIC ASSISTED TOTAL HYSTERECTOMY WITH BILATERAL SALPINGO OOPHORECTOMY;  Surgeon: Everitt Amber, MD;  Location: [facility];  Service: Gynecology;  Laterality: N/A;  . TUBAL LIGATION      Social History   Social History  . Marital status: Divorced    Spouse name: N/A  . Number of children: 3  . Years of education: N/A   Occupational History  . HR     Social History Main Topics  . Smoking status: Current Every Day Smoker    Packs/day: 0.25    Years: 20.00    Types: Cigarettes    Last attempt to quit: 08/10/2014  . Smokeless tobacco: Never Used     Comment: 3 cig per day   . Alcohol use 0.0 oz/week     Comment: 1 drink here and there  . Drug use: No  . Sexual activity: Not on file   Other Topics Concern  . Not on file   Social History Narrative   Work or School:      Home Situation:      Spiritual Beliefs:      Lifestyle:             Family History  Problem Relation Age of Onset  . Heart disease Mother   . Diabetes Mother   . Depression Maternal Grandmother   .  Depression Maternal Grandfather   . Cancer Maternal Grandfather     unknown type  . Asthma Son   . Asthma Daughter     No Known Allergies  Medication list reviewed and updated in full in [facility].   GEN: No acute illnesses, no fevers, chills. GI: No n/v/d, eating normally Pulm: No SOB Interactive and getting along well at home.  Otherwise, ROS is as per the HPI.  Objective:   BP (!) 158/82 (BP Location: Right Arm, Cuff Size: Large)   Pulse 96   Temp 98.8 F (37.1 C) (Oral)   Resp 16   Wt 196 lb (88.9 kg)   SpO2 98%   BMI 31.64 kg/m   GEN: WDWN, NAD, Non-toxic, A & O x 3 HEENT: Atraumatic, Normocephalic. Neck supple. No masses, No LAD. Ears and Nose:  No external deformity. CV: RRR, No M/G/R. No JVD. No thrill. No extra heart sounds. PULM: CTA B, no wheezes, crackles, rhonchi. No retractions. No resp. distress. No accessory muscle use. EXTR: No c/c/e NEURO Normal gait.  PSYCH: Normally interactive. Conversant. Not depressed or anxious appearing.  Calm demeanor.   Laboratory and Imaging Data:  Assessment and Plan:   Essential hypertension  Endometrial cancer ([place])   New diagnosis of hypertension.  Start hydrochlorothiazide.  Ongoing  Chemotherapy.  She will watch this, and she is routinely having her blood pressures checked by oncology.  Follow-up: No Follow-up on file.  New Prescriptions   HYDROCHLOROTHIAZIDE (HYDRODIURIL) 12.5 MG TABLET    Take 1 tablet (12.5 mg total) by mouth daily.   Patient Instructions  They will be checking your BP at your Oncology appointment.   Your BP goal is < 140/90.   If it stays persistently above this, then I would follow-up with Dr. Maudie Mercury.    Signed,  Maud Deed. Dior Stepter, MD   Patient's Medications  New Prescriptions   HYDROCHLOROTHIAZIDE (HYDRODIURIL) 12.5 MG TABLET    Take 1 tablet (12.5 mg total) by mouth daily.  Previous Medications   ASPIRIN EC 81 MG TABLET    Take 81 mg by mouth daily.   CALCIUM CARBONATE PO    Take 1 tablet by mouth daily.   COENZYME Q10 100 MG TABS    Take 100 mg by mouth daily.   DEXAMETHASONE (DECADRON) 4 MG TABLET    Take 5 tabs with food= 20 mg  12 hrs and 6 hrs prior to Taxol Chemotherapy.   DIPHENOXYLATE-ATROPINE (LOMOTIL) 2.5-0.025 MG TABLET    Take 2 tablets by mouth 4 (four) times daily as needed for diarrhea or loose stools.   DOXYLAMINE SUCCINATE, SLEEP, (SLEEP AID PO)    Take 1 tablet by mouth at bedtime as needed.   LOPERAMIDE (IMODIUM A-D) 2 MG TABLET    Take 2 mg by mouth 4 (four) times daily as needed for diarrhea or loose stools.   TRAMADOL (ULTRAM) 50 MG TABLET    Take 1 tablet (50 mg total) by mouth every 6 (six) hours as needed for severe pain.    VITAMIN C (ASCORBIC ACID) 500 MG TABLET    Take 500 mg by mouth daily.  Modified Medications   No medications on file  Discontinued Medications   LORAZEPAM (ATIVAN) 1 MG TABLET    Place 1/2-1 tablet under the tongue or swallow every 6 hours as needed for nausea. Will make drowsy   POTASSIUM CHLORIDE (K-DUR) 10 MEQ TABLET    Take 2 tablets (20 mEq total) by mouth daily.  PROCHLORPERAZINE (COMPAZINE) 10 MG TABLET    Take 1 tablet (10 mg total) by mouth every 6 (six) hours as needed for nausea or vomiting.   ZOLPIDEM (AMBIEN) 5 MG TABLET    Take 1 tablet (5 mg total) by mouth at bedtime as needed for sleep. Do not take with pain meds

## 2016-06-09 NOTE — Patient Instructions (Signed)
They will be checking your BP at your Oncology appointment.   Your BP goal is < 140/90.   If it stays persistently above this, then I would follow-up with Dr. Maudie Mercury.

## 2016-06-10 ENCOUNTER — Other Ambulatory Visit: Payer: Self-pay | Admitting: Oncology

## 2016-06-11 ENCOUNTER — Ambulatory Visit (HOSPITAL_BASED_OUTPATIENT_CLINIC_OR_DEPARTMENT_OTHER): Payer: 59

## 2016-06-11 ENCOUNTER — Other Ambulatory Visit (HOSPITAL_BASED_OUTPATIENT_CLINIC_OR_DEPARTMENT_OTHER): Payer: 59

## 2016-06-11 ENCOUNTER — Ambulatory Visit (HOSPITAL_BASED_OUTPATIENT_CLINIC_OR_DEPARTMENT_OTHER): Payer: 59 | Admitting: Oncology

## 2016-06-11 VITALS — BP 154/76 | HR 93 | Temp 98.1°F | Resp 18 | Ht 66.0 in | Wt 194.7 lb

## 2016-06-11 DIAGNOSIS — C541 Malignant neoplasm of endometrium: Secondary | ICD-10-CM | POA: Diagnosis not present

## 2016-06-11 DIAGNOSIS — D701 Agranulocytosis secondary to cancer chemotherapy: Secondary | ICD-10-CM

## 2016-06-11 DIAGNOSIS — T451X5A Adverse effect of antineoplastic and immunosuppressive drugs, initial encounter: Secondary | ICD-10-CM

## 2016-06-11 DIAGNOSIS — Z5111 Encounter for antineoplastic chemotherapy: Secondary | ICD-10-CM

## 2016-06-11 DIAGNOSIS — G620 Drug-induced polyneuropathy: Secondary | ICD-10-CM

## 2016-06-11 LAB — CBC WITH DIFFERENTIAL/PLATELET
BASO%: 0.9 % (ref 0.0–2.0)
BASOS ABS: 0 10*3/uL (ref 0.0–0.1)
EOS%: 9.5 % — AB (ref 0.0–7.0)
Eosinophils Absolute: 0.2 10*3/uL (ref 0.0–0.5)
HEMATOCRIT: 32.3 % — AB (ref 34.8–46.6)
HEMOGLOBIN: 10.9 g/dL — AB (ref 11.6–15.9)
LYMPH#: 0.7 10*3/uL — AB (ref 0.9–3.3)
LYMPH%: 27.9 % (ref 14.0–49.7)
MCH: 31.1 pg (ref 25.1–34.0)
MCHC: 33.6 g/dL (ref 31.5–36.0)
MCV: 92.3 fL (ref 79.5–101.0)
MONO#: 0.1 10*3/uL (ref 0.1–0.9)
MONO%: 4.7 % (ref 0.0–14.0)
NEUT#: 1.5 10*3/uL (ref 1.5–6.5)
NEUT%: 57 % (ref 38.4–76.8)
PLATELETS: 155 10*3/uL (ref 145–400)
RBC: 3.5 10*6/uL — ABNORMAL LOW (ref 3.70–5.45)
RDW: 15.8 % — AB (ref 11.2–14.5)
WBC: 2.6 10*3/uL — ABNORMAL LOW (ref 3.9–10.3)

## 2016-06-11 LAB — COMPREHENSIVE METABOLIC PANEL
ALBUMIN: 4.2 g/dL (ref 3.5–5.0)
ALK PHOS: 92 U/L (ref 40–150)
ALT: 22 U/L (ref 0–55)
ANION GAP: 12 meq/L — AB (ref 3–11)
AST: 12 U/L (ref 5–34)
BUN: 21 mg/dL (ref 7.0–26.0)
CALCIUM: 10.4 mg/dL (ref 8.4–10.4)
CHLORIDE: 97 meq/L — AB (ref 98–109)
CO2: 28 mEq/L (ref 22–29)
Creatinine: 0.9 mg/dL (ref 0.6–1.1)
EGFR: 70 mL/min/{1.73_m2} — AB (ref >90)
Glucose: 106 mg/dl (ref 70–140)
POTASSIUM: 3.6 meq/L (ref 3.5–5.1)
Sodium: 138 mEq/L (ref 136–145)
Total Bilirubin: 0.43 mg/dL (ref 0.20–1.20)
Total Protein: 7.5 g/dL (ref 6.4–8.3)

## 2016-06-11 MED ORDER — TBO-FILGRASTIM 480 MCG/0.8ML ~~LOC~~ SOSY
480.0000 ug | PREFILLED_SYRINGE | Freq: Once | SUBCUTANEOUS | Status: AC
  Administered 2016-06-11: 480 ug via SUBCUTANEOUS
  Filled 2016-06-11: qty 0.8

## 2016-06-11 NOTE — Progress Notes (Signed)
OFFICE PROGRESS NOTE   June 11, 2016   Physicians:Emma Rossi,Kim, Nickola Major, DO, Gery Pray, Marylynn Pearson, (M.Wert)  INTERVAL HISTORY:  Patient is seen, alone for visit, as she continues adjuvant therapy for IIIC1 high grade endometrial carcinoma. She completed IMRT with CDDP at initiation and completion, then vaginal brachytherapy; she had second carbo taxol on 06-05-16 with gCSF planned x 3 days begun 06-09-16. Plan is for 4 cycles of carboplatin taxol.  Patient tolerated cycle 2 carbo taxol well overall, tho had more aches knees and lower legs after taxol, no significant N/V, bowels ok, slight peripheral neuropathy tips of fingers and distal feet as she had transiently with cycle 1 also. Due to more elevated BP when at this office for granix on 06-09-16, she was seen at walk in clinic at Dr Kim's office also on 06-09-16, begun on HCTZ 12.5 mg q AM then. She has been much more thirsty on the HCTZ, voiding more frequently.  She has had no fever, no dysuria, no bleeding, no SOB, no abdominal or pelvic pain. Peripheral IV access ok.  Remainder of 10 point Review of Systems negative.   No central catheter MSI high. Genetics counseling scheduled for 06-13-16  ONCOLOGIC HISTORY Patient had postmenopausal spotting 09-2014, with gyn exam and PAP then negative. She had 3 days of spotting in Sept 2016, then slightly heavier spotting in 12-2015. US showed thickened endometrial stripe with normal size uterus, and 1.3 cm dermoid on left ovary. Hysteroscopy D&C 12-27-15 found grade 1 endometrial carcinoma. She was referred to Dr Denman George, with robotic assisted total hysterectomy with BSO and sentinel node evaluation done 01-19-16. Pathology [phone]) found grade 3 endometrial adenocarcinoma (poorly differentiated arising from well differentiated adenocarcinoma), 7.2 cm tumor invading into outer half of myometrium with benign cervix, tubes, ovaries. No definite LVSI. 1 of 8 right pelvic nodes with isolated tumor  cell, 1 left pelvic node + in addition to 1 left pelvic node with isolated tumor cells and 4 left pelvic nodes negative. Patient was DC home within 24 hours, no prophylactic lovenox at DC.  GIven grade 3 pathology, she had CT CAP 02-02-16 on 02-02-16, with no clear distant disease and no obvious residual pelvic disease. Recommendation is for pelvic radiation with CDDP days 1 and 28, then 4 cycles of carboplatin taxol chemotherapy. First CDDP was given 03-05-16 and second on 04-05-16. She had first carbo taxol on 05-11-16, neutropenic with ANC 0.2 by day 11 cycle 1.    Objective:  Vital signs in last 24 hours:  BP (!) 154/76 (BP Location: Left Arm, Patient Position: Sitting) Comment: informed nurse  Pulse 93   Temp 98.1 F (36.7 C) (Oral)   Resp 18   Ht _0  (1.676 m)   Wt 194 lb 11.2 oz (88.3 kg)   SpO2 99%   BMI 31.43 kg/m  Weight stable Alert, oriented and appropriate. Ambulatory without difficulty.  Alopecia  HEENT:PERRL, sclerae not icteric. Oral mucosa a little dry without lesions, posterior pharynx clear.  Neck supple. No JVD.  Lymphatics:no cervical,supraclavicular or inguinal adenopathy Resp: clear to auscultation bilaterally and normal percussion bilaterally Cardio: regular rate and rhythm. No gallop. GI: soft, nontender, not distended, no mass or organomegaly. Normally active bowel sounds. Surgical incisions not remarkable. Musculoskeletal/ Extremities: without pitting edema, cords, tenderness Neuro: minimal peripheral neuropathy as noted. Otherwise nonfocal. PSYCH appropriate mood and affect Skin without rash, ecchymosis, petechiae   Lab Results:  Results for orders placed or performed in visit on 06/11/16  CBC with  Differential  Result Value Ref Range   WBC 2.6 (L) 3.9 - 10.3 10e3/uL   NEUT# 1.5 1.5 - 6.5 10e3/uL   HGB 10.9 (L) 11.6 - 15.9 g/dL   HCT 32.3 (L) 34.8 - 46.6 %   Platelets 155 145 - 400 10e3/uL   MCV 92.3 79.5 - 101.0 fL   MCH 31.1 25.1 - 34.0 pg    MCHC 33.6 31.5 - 36.0 g/dL   RBC 3.50 (L) 3.70 - 5.45 10e6/uL   RDW 15.8 (H) 11.2 - 14.5 %   lymph# 0.7 (L) 0.9 - 3.3 10e3/uL   MONO# 0.1 0.1 - 0.9 10e3/uL   Eosinophils Absolute 0.2 0.0 - 0.5 10e3/uL   Basophils Absolute 0.0 0.0 - 0.1 10e3/uL   NEUT% 57.0 38.4 - 76.8 %   LYMPH% 27.9 14.0 - 49.7 %   MONO% 4.7 0.0 - 14.0 %   EOS% 9.5 (H) 0.0 - 7.0 %   BASO% 0.9 0.0 - 2.0 %  Comprehensive metabolic panel  Result Value Ref Range   Sodium 138 136 - 145 mEq/L   Potassium 3.6 3.5 - 5.1 mEq/L   Chloride 97 (L) 98 - 109 mEq/L   CO2 28 22 - 29 mEq/L   Glucose 106 70 - 140 mg/dl   BUN 21.0 7.0 - 26.0 mg/dL   Creatinine 0.9 0.6 - 1.1 mg/dL   Total Bilirubin 0.43 0.20 - 1.20 mg/dL   Alkaline Phosphatase 92 40 - 150 U/L   AST 12 5 - 34 U/L   ALT 22 0 - 55 U/L   Total Protein 7.5 6.4 - 8.3 g/dL   Albumin 4.2 3.5 - 5.0 g/dL   Calcium 10.4 8.4 - 10.4 mg/dL   Anion Gap 12 (H) 3 - 11 mEq/L   EGFR 70 (L) >90 ml/min/1.73 m2     Studies/Results:  No results found.  Medications: I have reviewed the patient's current medications. On HCTZ 12.5 mg as of 06-09-16 by PCP Chemo orders for cycle 3 : carbo dose increased to AUC = 6 as now out further from pelvic RT.  DISCUSSION Patient feels that she is tolerating chemotherapy adequately and is in agreement with continuing as planned, cycle 3 due 10-13 as long as labs ok at MD visit 10-12. Granix today and 10-3. Recommended that she avoid people who might be ill over next several days. Genetics 10-4 Dr Sondra Come 10-5, which is one month follow up.  Message to genetics as they may want to add genetics labs to next chemo labs on 10-12.  Discussed chemo peripheral neuropathy, which hopefully will improve out further from most recent chemo, will follow  Assessment/Plan:  1.IIIC1 poorly differentiated endometrial adenocarcinoma arising in well differentiated endometrial adenocarcinoma: post complete debulking by Dr Denman George 01-19-16, pelvic radiation with  CDDP x2, HDR and first of planned four cycles carbo taxol given 05-11-16, tolerated well other than neutropenia by day 11 and moderate taxol aches. Continuing granix now post cycle 2. Will have cycle 3 10-13 as long as labs and visit ok 10-12.  High MSI,genetics counseling scheduled 06-13-16. 2 chemo peripheral neuropathy: minimal and not bothersome, follow 3.long minimal tobacco abuse: counseled for tobacco cessation, will continue to address 4.overdue mammograms which we will try to address at least after completion of treatment. 5.post cholecystectomy and lumbar disc surgery x2 6.no colonoscopy, which will also be addressed as possible 7.environmental allergies 8.benign arrhythmias on Holter 2014 9.Needs flu vaccine at least by end of October, prefer not on days of chemo  or steroids.  10.HTN: just begun on low dose thiazide diuretic. WIll follow including K  All questions answered and patient knows to call if concerns including fever or symptoms of infection prior to next scheduled visit. Granix orders confirmed.  Time spent 25 min including >50% counseling and coordination of care. Route PCP, cc Dr Sondra Come. Message to genetics.   Evlyn Clines, MD   06/11/2016, 8:27 PM

## 2016-06-11 NOTE — Patient Instructions (Signed)

## 2016-06-12 ENCOUNTER — Encounter: Payer: Self-pay | Admitting: Oncology

## 2016-06-12 ENCOUNTER — Ambulatory Visit (HOSPITAL_BASED_OUTPATIENT_CLINIC_OR_DEPARTMENT_OTHER): Payer: 59

## 2016-06-12 VITALS — BP 157/73 | HR 101 | Temp 98.1°F | Resp 20

## 2016-06-12 DIAGNOSIS — C541 Malignant neoplasm of endometrium: Secondary | ICD-10-CM

## 2016-06-12 DIAGNOSIS — D701 Agranulocytosis secondary to cancer chemotherapy: Secondary | ICD-10-CM

## 2016-06-12 MED ORDER — TBO-FILGRASTIM 480 MCG/0.8ML ~~LOC~~ SOSY
480.0000 ug | PREFILLED_SYRINGE | Freq: Once | SUBCUTANEOUS | Status: AC
  Administered 2016-06-12: 480 ug via SUBCUTANEOUS
  Filled 2016-06-12: qty 0.8

## 2016-06-12 NOTE — Patient Instructions (Signed)

## 2016-06-13 ENCOUNTER — Encounter: Payer: 59 | Admitting: Genetic Counselor

## 2016-06-13 ENCOUNTER — Other Ambulatory Visit: Payer: 59

## 2016-06-13 ENCOUNTER — Encounter: Payer: Self-pay | Admitting: Oncology

## 2016-06-14 ENCOUNTER — Ambulatory Visit
Admission: RE | Admit: 2016-06-14 | Discharge: 2016-06-14 | Disposition: A | Discharge status: Home / Self Care | Payer: 59 | Source: Ambulatory Visit | Attending: Radiation Oncology | Admitting: Radiation Oncology

## 2016-06-14 ENCOUNTER — Telehealth: Payer: Self-pay | Admitting: *Deleted

## 2016-06-14 ENCOUNTER — Telehealth: Payer: Self-pay | Admitting: Oncology

## 2016-06-14 NOTE — Telephone Encounter (Signed)
Left a message for Amy Holmes regarding her follow up appointment with Dr. Sondra Come today and let her know that we can reschedule the appointment if necessary.  Requested a return call.

## 2016-06-14 NOTE — Telephone Encounter (Signed)
"  I need someone in scheduling.  I have an appointment this afternoon with Dr. Sondra Come."   Call transferred ext [phone].

## 2016-06-14 NOTE — Telephone Encounter (Signed)
Amy Holmes left a message saying that she would come in to pick up a dilator on Thursday next week after her appointment with Dr. Marko Plume.

## 2016-06-20 ENCOUNTER — Other Ambulatory Visit: Payer: Self-pay | Admitting: Oncology

## 2016-06-21 ENCOUNTER — Ambulatory Visit (HOSPITAL_BASED_OUTPATIENT_CLINIC_OR_DEPARTMENT_OTHER): Payer: 59 | Admitting: Oncology

## 2016-06-21 ENCOUNTER — Encounter: Payer: Self-pay | Admitting: Oncology

## 2016-06-21 ENCOUNTER — Other Ambulatory Visit (HOSPITAL_BASED_OUTPATIENT_CLINIC_OR_DEPARTMENT_OTHER): Payer: 59

## 2016-06-21 ENCOUNTER — Telehealth: Payer: Self-pay | Admitting: *Deleted

## 2016-06-21 VITALS — BP 149/67 | HR 83 | Temp 98.0°F | Resp 18 | Ht 66.0 in | Wt 196.0 lb

## 2016-06-21 DIAGNOSIS — G620 Drug-induced polyneuropathy: Secondary | ICD-10-CM | POA: Diagnosis not present

## 2016-06-21 DIAGNOSIS — T451X5A Adverse effect of antineoplastic and immunosuppressive drugs, initial encounter: Secondary | ICD-10-CM

## 2016-06-21 DIAGNOSIS — C541 Malignant neoplasm of endometrium: Secondary | ICD-10-CM | POA: Diagnosis not present

## 2016-06-21 DIAGNOSIS — Z23 Encounter for immunization: Secondary | ICD-10-CM | POA: Diagnosis not present

## 2016-06-21 DIAGNOSIS — E876 Hypokalemia: Secondary | ICD-10-CM

## 2016-06-21 DIAGNOSIS — I10 Essential (primary) hypertension: Secondary | ICD-10-CM

## 2016-06-21 DIAGNOSIS — D701 Agranulocytosis secondary to cancer chemotherapy: Secondary | ICD-10-CM

## 2016-06-21 LAB — COMPREHENSIVE METABOLIC PANEL
ALT: 16 U/L (ref 0–55)
AST: 12 U/L (ref 5–34)
Albumin: 4 g/dL (ref 3.5–5.0)
Alkaline Phosphatase: 84 U/L (ref 40–150)
Anion Gap: 11 mEq/L (ref 3–11)
BILIRUBIN TOTAL: 0.38 mg/dL (ref 0.20–1.20)
BUN: 18.6 mg/dL (ref 7.0–26.0)
CHLORIDE: 104 meq/L (ref 98–109)
CO2: 27 meq/L (ref 22–29)
CREATININE: 0.7 mg/dL (ref 0.6–1.1)
Calcium: 9.5 mg/dL (ref 8.4–10.4)
EGFR: 87 mL/min/{1.73_m2} — ABNORMAL LOW (ref >90)
GLUCOSE: 85 mg/dL (ref 70–140)
Potassium: 3.4 mEq/L — ABNORMAL LOW (ref 3.5–5.1)
SODIUM: 142 meq/L (ref 136–145)
TOTAL PROTEIN: 7.3 g/dL (ref 6.4–8.3)

## 2016-06-21 LAB — CBC WITH DIFFERENTIAL/PLATELET
BASO%: 0.9 % (ref 0.0–2.0)
Basophils Absolute: 0 10*3/uL (ref 0.0–0.1)
EOS%: 1.8 % (ref 0.0–7.0)
Eosinophils Absolute: 0.1 10*3/uL (ref 0.0–0.5)
HCT: 29.9 % — ABNORMAL LOW (ref 34.8–46.6)
HGB: 10.3 g/dL — ABNORMAL LOW (ref 11.6–15.9)
LYMPH%: 23.5 % (ref 14.0–49.7)
MCH: 32.1 pg (ref 25.1–34.0)
MCHC: 34.4 g/dL (ref 31.5–36.0)
MCV: 93.1 fL (ref 79.5–101.0)
MONO#: 0.3 10*3/uL (ref 0.1–0.9)
MONO%: 8.7 % (ref 0.0–14.0)
NEUT%: 65.1 % (ref 38.4–76.8)
NEUTROS ABS: 2.2 10*3/uL (ref 1.5–6.5)
Platelets: 132 10*3/uL — ABNORMAL LOW (ref 145–400)
RBC: 3.21 10*6/uL — AB (ref 3.70–5.45)
RDW: 15.8 % — ABNORMAL HIGH (ref 11.2–14.5)
WBC: 3.3 10*3/uL — AB (ref 3.9–10.3)
lymph#: 0.8 10*3/uL — ABNORMAL LOW (ref 0.9–3.3)

## 2016-06-21 MED ORDER — INFLUENZA VAC SPLIT QUAD 0.5 ML IM SUSY
0.5000 mL | PREFILLED_SYRINGE | Freq: Once | INTRAMUSCULAR | Status: AC
  Administered 2016-06-21: 0.5 mL via INTRAMUSCULAR
  Filled 2016-06-21: qty 0.5

## 2016-06-21 NOTE — Telephone Encounter (Signed)
Per staff message I have scheduled appt from 10/13 to 10/17

## 2016-06-21 NOTE — Progress Notes (Signed)
OFFICE PROGRESS NOTE   June 21, 2016   Physicians: Charlene Brooke, Nickola Major, DO, Gery Pray, Marylynn Pearson, (M.Wert)   INTERVAL HISTORY:  Patient is seen, alone for visit, in continuing attention to adjuvant chemotherapy in process for IIIC1 high grade endometrial carcinoma. She received initial IMRT with CDDP days 1 and 28, then vaginal brachytherapy. She continues carbo taxol, planned for 4 cycles; she has requested rescheduling of cycle 3 from 06-22-16 to 06-26-16 due to a funeral.  Patient tolerated cycle 2 carbo taxol on 06-05-16 without any major problems, with granix days 5,6,7 due to neutropenia cycle 1. Taxol aches were tolerable, does notice slight peripheral neuropathy fingers and feet which is not interfering with function. Appetite is good, no significant nausea, bowels are moving regularly, no abdominal or pelvic pain, no bleeding. No LE swelling, no SOB, no fever or symptoms of infection.  No central catheter MSI high. Genetics counseling scheduled for 06-13-16 but apparently needs to reschedule  ONCOLOGIC HISTORY Patient had postmenopausal spotting 09-2014, with gyn exam and PAP then negative. She had 3 days of spotting in Sept 2016, then slightly heavier spotting in 12-2015. US showed thickened endometrial stripe with normal size uterus, and 1.3 cm dermoid on left ovary. Hysteroscopy D&C 12-27-15 found grade 1 endometrial carcinoma. She was referred to Dr Denman George, with robotic assisted total hysterectomy with BSO and sentinel node evaluation done 01-19-16. Pathology [phone]) found grade 3 endometrial adenocarcinoma (poorly differentiated arising from well differentiated adenocarcinoma), 7.2 cm tumor invading into outer half of myometrium with benign cervix, tubes, ovaries. No definite LVSI. 1 of 8 right pelvic nodes with isolated tumor cell, 1 left pelvic node + in addition to 1 left pelvic node with isolated tumor cells and 4 left pelvic nodes negative. Patient was DC home  within 24 hours, no prophylactic lovenox at DC.  GIven grade 3 pathology, she had CT CAP 02-02-16 on 02-02-16, with no clear distant disease and no obvious residual pelvic disease. Recommendation is for pelvic radiation with CDDP days 1 and 28, then 4 cycles of carboplatin taxol chemotherapy. First CDDP was given 03-05-16 and second on 04-05-16. She had first carbo taxol on 05-11-16, neutropenic with ANC 0.2 by day 11 cycle 1.     Objective:  Vital signs in last 24 hours:  BP (!) 149/67 (BP Location: Left Arm, Patient Position: Sitting)   Pulse 83   Temp 98 F (36.7 C) (Oral)   Resp 18   Ht 5' 6"  (1.676 m)   Wt 196 lb (88.9 kg)   SpO2 100%   BMI 31.64 kg/m  Weight up 2 lbs Alert, oriented and appropriate. Ambulatory without difficulty, easily able to get on and off exam table  Complete alopecia  HEENT:PERRL, sclerae not icteric. Oral mucosa moist without lesions, posterior pharynx clear.  Neck supple. No JVD.  Lymphatics:no cervical,supraclavicular or inguinal adenopathy Resp: clear to auscultation bilaterally and normal percussion bilaterally Cardio: regular rate and rhythm. No gallop. GI: soft, nontender, not distended, no mass or organomegaly. Normally active bowel sounds. Surgical incision not remarkable. Musculoskeletal/ Extremities: without pitting edema, cords, tenderness Neuro: no significant peripheral neuropathy. Otherwise nonfocal. PSYCH appropriate mood and affect Skin without rash, ecchymosis, petechiae   Lab Results:  Results for orders placed or performed in visit on 06/21/16  CBC with Differential  Result Value Ref Range   WBC 3.3 (L) 3.9 - 10.3 10e3/uL   NEUT# 2.2 1.5 - 6.5 10e3/uL   HGB 10.3 (L) 11.6 - 15.9 g/dL  HCT 29.9 (L) 34.8 - 46.6 %   Platelets 132 (L) 145 - 400 10e3/uL   MCV 93.1 79.5 - 101.0 fL   MCH 32.1 25.1 - 34.0 pg   MCHC 34.4 31.5 - 36.0 g/dL   RBC 3.21 (L) 3.70 - 5.45 10e6/uL   RDW 15.8 (H) 11.2 - 14.5 %   lymph# 0.8 (L) 0.9 - 3.3  10e3/uL   MONO# 0.3 0.1 - 0.9 10e3/uL   Eosinophils Absolute 0.1 0.0 - 0.5 10e3/uL   Basophils Absolute 0.0 0.0 - 0.1 10e3/uL   NEUT% 65.1 38.4 - 76.8 %   LYMPH% 23.5 14.0 - 49.7 %   MONO% 8.7 0.0 - 14.0 %   EOS% 1.8 0.0 - 7.0 %   BASO% 0.9 0.0 - 2.0 %  Comprehensive metabolic panel  Result Value Ref Range   Sodium 142 136 - 145 mEq/L   Potassium 3.4 (L) 3.5 - 5.1 mEq/L   Chloride 104 98 - 109 mEq/L   CO2 27 22 - 29 mEq/L   Glucose 85 70 - 140 mg/dl   BUN 18.6 7.0 - 26.0 mg/dL   Creatinine 0.7 0.6 - 1.1 mg/dL   Total Bilirubin 0.38 0.20 - 1.20 mg/dL   Alkaline Phosphatase 84 40 - 150 U/L   AST 12 5 - 34 U/L   ALT 16 0 - 55 U/L   Total Protein 7.3 6.4 - 8.3 g/dL   Albumin 4.0 3.5 - 5.0 g/dL   Calcium 9.5 8.4 - 10.4 mg/dL   Anion Gap 11 3 - 11 mEq/L   EGFR 87 (L) >90 ml/min/1.73 m2   CBC reviewed at time of visit.  Studies/Results:  No results found.  Medications: I have reviewed the patient's current medications. Carbo cycle 3 AUC = 6.  DISCUSSION Peripheral neuropathy from taxol discussed. Patient understands and agrees with continuing cycle 3.  Written prescription for wig.  Assessment/Plan:  1.IIIC1 poorly differentiated endometrial adenocarcinoma arising in well differentiated endometrial adenocarcinoma: post complete debulking by Dr Denman George 01-19-16, pelvic radiation with CDDP x2, HDR and first of planned four cycles carbo taxol given 05-11-16, tolerated well other than neutropenia by day 11 andmoderate taxol aches. Counts ok cycle 2 with addition of granix. Will have cycle 3 on 10-17, slight delay as patient needs to attend a funeral, AUC = 6 so will need at least 3 doses granix, possibly additional.  High MSI,genetics counseling still needed. 2 chemo peripheral neuropathy: minimal and not bothersome, follow 3.long minimal tobacco abuse: counseled for tobacco cessation, will continue to address 4.overdue mammograms which we will try to address at least after  completion of treatment. 5.post cholecystectomy and lumbar disc surgery x2 6.no colonoscopy, which will also be addressed as possible 7.environmental allergies 8.benign arrhythmias on Holter 2014 9.Needs flu vaccine at least by end of October, prefer not on days of chemo or steroids.  10.HTN: recently begun on low dose thiazide diuretic. Potassium slightly low, will recommend increase in diet, send labs to PCP, follow with next chemistries  All questions answered. Chemo and granix orders confirmed. Time spent 25 min including >50% counseling and coordination of care. Route Dr Maudie Mercury   Evlyn Clines, MD   06/21/2016, 6:02 PM

## 2016-06-22 ENCOUNTER — Telehealth: Payer: Self-pay | Admitting: Oncology

## 2016-06-22 ENCOUNTER — Other Ambulatory Visit: Payer: 59

## 2016-06-22 ENCOUNTER — Ambulatory Visit
Admission: RE | Admit: 2016-06-22 | Discharge: 2016-06-22 | Disposition: A | Discharge status: Home / Self Care | Payer: 59 | Source: Ambulatory Visit | Attending: Radiation Oncology | Admitting: Radiation Oncology

## 2016-06-22 DIAGNOSIS — C541 Malignant neoplasm of endometrium: Secondary | ICD-10-CM | POA: Insufficient documentation

## 2016-06-22 DIAGNOSIS — Z7982 Long term (current) use of aspirin: Secondary | ICD-10-CM | POA: Insufficient documentation

## 2016-06-22 DIAGNOSIS — Z79899 Other long term (current) drug therapy: Secondary | ICD-10-CM | POA: Diagnosis not present

## 2016-06-22 DIAGNOSIS — Z923 Personal history of irradiation: Secondary | ICD-10-CM | POA: Insufficient documentation

## 2016-06-22 NOTE — Telephone Encounter (Signed)
Amy Holmes called and said she forgot to come by yesterday to pick up her dilator.  She said she would come by on Monday to pick it up.

## 2016-06-22 NOTE — Progress Notes (Signed)
Patient showed up at nursing asking about vaginal dilator, asked patient to step in dressing room 7, spoke with Dr. Sondra Come , then went and gave a small Plus and a medium vaginal dilator, gave instructions of use, to use 3x week, Monday,Wednesday and Friday night before bedtime, preferably, also gave sugilube samples to , insert dilator  After applying surgilube on dilator as far as comfortably as she can without causing pain and leave inserted for 10-15 minutes, wash afterwards with hot soapy water and air dry, if super plus is too loose, then use the medium dilator, verbal understanding by the patient, teach back given, she can buy surgilube a t any pharmacy 4:07 PM

## 2016-06-22 NOTE — Progress Notes (Signed)
  Radiation Oncology         [phone] ________________________________  Name: Amy Holmes MRN: [id number]  Date: 06/22/2016  DOB: 1956-01-12  Follow-Up Visit Note  CC: Lucretia Kern., DO  Lucretia Kern, DO    ICD-9-CM ICD-10-CM   1. [employer] Federation of Gyne[employer] Obstetric[employer] (FIGO) stage IIIC1 malignant neoplasm of endometrium ([place]) 182.0 C54.1     Diagnosis:  Stage IIIC1 high grade endometrioid adenocarcinoma  Interval Since Last Radiation:  6 weeks  Narrative:  The patient returns today for routine follow-up.  She is doing well this time. She denies any vaginal bleeding vaginal discharge urination difficulties or bowel complaints.                              ALLERGIES:  has No Known Allergies.  Meds: Current Outpatient Prescriptions  Medication Sig Dispense Refill  . aspirin EC 81 MG tablet Take 81 mg by mouth daily.    Marland Kitchen CALCIUM CARBONATE PO Take 1 tablet by mouth daily.    . Coenzyme Q10 100 MG TABS Take 100 mg by mouth daily.    Marland Kitchen dexamethasone (DECADRON) 4 MG tablet Take 5 tabs with food= 20 mg  12 hrs and 6 hrs prior to Taxol Chemotherapy. (Patient not taking: Reported on 06/21/2016) 10 tablet 2  . diphenoxylate-atropine (LOMOTIL) 2.5-0.025 MG tablet Take 2 tablets by mouth 4 (four) times daily as needed for diarrhea or loose stools. (Patient not taking: Reported on 06/21/2016) 30 tablet 0  . Doxylamine Succinate, Sleep, (SLEEP AID PO) Take 1 tablet by mouth at bedtime as needed.    . hydrochlorothiazide (HYDRODIURIL) 12.5 MG tablet Take 1 tablet (12.5 mg total) by mouth daily. 30 tablet 3  . loperamide (IMODIUM A-D) 2 MG tablet Take 2 mg by mouth 4 (four) times daily as needed for diarrhea or loose stools.    . traMADol (ULTRAM) 50 MG tablet Take 1 tablet (50 mg total) by mouth every 6 (six) hours as needed for severe pain. (Patient not taking: Reported on 06/21/2016) 20 tablet 0  . vitamin C (ASCORBIC ACID) 500 MG tablet Take 500 mg by mouth daily.      No current facility-administered medications for this encounter.     Physical Findings: The patient is in no acute distress. Patient is alert and oriented.  Lungs are clear to auscultation bilaterally. Heart has regular rate and rhythm. No palpable cervical, supraclavicular, or axillary adenopathy. Abdomen soft, non-tender, normal bowel sounds.  Lab Findings: Lab Results  Component Value Date   WBC 3.3 (L) 06/21/2016   HGB 10.3 (L) 06/21/2016   HCT 29.9 (L) 06/21/2016   MCV 93.1 06/21/2016   PLT 132 (L) 06/21/2016    Radiographic Findings: No results found.  Impression:  She is doing well at this time. She has 2 more cycles of adjuvant chemotherapy to complete her therapy.  Plan:  Patient will follow-up with Dr. Denman George in approximately 2 months. Will follow-up in radiation oncology in 5 months.  Today the patient was given a vaginal dilator and instructions on its use in light of her pelvic and intracavitary radiation treatments  ____________________________________ Gery Pray, MD

## 2016-06-25 ENCOUNTER — Telehealth: Payer: Self-pay | Admitting: Medical Oncology

## 2016-06-25 DIAGNOSIS — C541 Malignant neoplasm of endometrium: Secondary | ICD-10-CM

## 2016-06-25 MED ORDER — DEXAMETHASONE 4 MG PO TABS: ORAL_TABLET | ORAL | 2 refills | Status: DC

## 2016-06-25 NOTE — Addendum Note (Signed)
Encounter addended by: Doreen Beam, RN on: 06/25/2016  8:10 AM<BR>    Actions taken: Sign clinical note

## 2016-06-25 NOTE — Progress Notes (Signed)
  Home Care Instructions for the Insertion and Care of Your Vaginal Dilator  Why Do I Need a Vaginal Dilator?  Internal radiation therapy may cause scar tissue to form at the top of your vagina (vaginal cuff).  This may make vaginal examinations difficult in the future. You can prevent scar tissue from forming by using a vaginal dilator (a smooth plastic rod), and/or by having regular sexual intercourse.  If not using the dilator you should be having intercourse two or three times a week.  If you are unable to have intercourse, you should use your vaginal dilator.  You may have some spotting or bleeding from your dilator or intercourse the first few times. You may also have some discomfort. If discomfort occurs with intercourse, you and your partner may need to stop for a while and try again later.  How to Use Your Vaginal Dilator  - Wash the dilator with soap and water before and after each use. - Check the dilator to be sure it is smooth. Do not use the dilator if you find any roughspots. - Coat the dilator with K-Y Jelly, Astroglide, or Replens. Do not use Vaseline, baby oil, or other oil based lubricants. They are not water-soluble and can be irritating to the tissues in the vagina. - Lie on your back with your knees bent and legs apart. - Insert the rounded end of the dilator into your vagina as far as it will go without causing pain or discomfort. - Close your knees and slowly straighten your legs. - Keep the dilator in your vagina for about 10 to 15 minutes. - Bend your knees, open your legs, and gently remove the dilator. - Gently cleanse the skin around the vaginal opening. - Wash the dilator after each use. -  It is important that you use the dilator routinely until instructed otherwise by your doctor.   

## 2016-06-25 NOTE — Progress Notes (Signed)
.  kh

## 2016-06-25 NOTE — Telephone Encounter (Signed)
Needs premed refill -decadron . I called it  in for chemo tomorrow.

## 2016-06-25 NOTE — Addendum Note (Signed)
Encounter addended by: Jacqulyn Liner, RN on: 06/25/2016  8:12 AM<BR>    Actions taken: Sign clinical note

## 2016-06-26 ENCOUNTER — Ambulatory Visit: Payer: 59

## 2016-06-26 ENCOUNTER — Ambulatory Visit (HOSPITAL_BASED_OUTPATIENT_CLINIC_OR_DEPARTMENT_OTHER): Payer: 59

## 2016-06-26 ENCOUNTER — Other Ambulatory Visit: Payer: 59

## 2016-06-26 VITALS — BP 147/62 | HR 97 | Temp 98.1°F | Resp 18

## 2016-06-26 DIAGNOSIS — Z5111 Encounter for antineoplastic chemotherapy: Secondary | ICD-10-CM | POA: Diagnosis not present

## 2016-06-26 DIAGNOSIS — C541 Malignant neoplasm of endometrium: Secondary | ICD-10-CM

## 2016-06-26 MED ORDER — ONDANSETRON HCL 8 MG PO TABS
ORAL_TABLET | ORAL | Status: AC
  Filled 2016-06-26: qty 1

## 2016-06-26 MED ORDER — DIPHENHYDRAMINE HCL 50 MG/ML IJ SOLN
50.0000 mg | Freq: Once | INTRAMUSCULAR | Status: AC
  Administered 2016-06-26: 50 mg via INTRAVENOUS

## 2016-06-26 MED ORDER — DIPHENHYDRAMINE HCL 50 MG/ML IJ SOLN
INTRAMUSCULAR | Status: AC
  Filled 2016-06-26: qty 1

## 2016-06-26 MED ORDER — FAMOTIDINE IN NACL 20-0.9 MG/50ML-% IV SOLN
20.0000 mg | Freq: Once | INTRAVENOUS | Status: AC
  Administered 2016-06-26: 20 mg via INTRAVENOUS

## 2016-06-26 MED ORDER — ONDANSETRON HCL 8 MG PO TABS
8.0000 mg | ORAL_TABLET | Freq: Once | ORAL | Status: AC
  Administered 2016-06-26: 8 mg via ORAL

## 2016-06-26 MED ORDER — SODIUM CHLORIDE 0.9 % IV SOLN
750.0000 mg | Freq: Once | INTRAVENOUS | Status: AC
  Administered 2016-06-26: 750 mg via INTRAVENOUS
  Filled 2016-06-26: qty 75

## 2016-06-26 MED ORDER — SODIUM CHLORIDE 0.9 % IV SOLN
Freq: Once | INTRAVENOUS | Status: AC
  Administered 2016-06-26: 12:00:00 via INTRAVENOUS

## 2016-06-26 MED ORDER — PACLITAXEL CHEMO INJECTION 300 MG/50ML
175.0000 mg/m2 | Freq: Once | INTRAVENOUS | Status: AC
  Administered 2016-06-26: 354 mg via INTRAVENOUS
  Filled 2016-06-26: qty 59

## 2016-06-26 MED ORDER — SODIUM CHLORIDE 0.9 % IV SOLN
Freq: Once | INTRAVENOUS | Status: AC
  Administered 2016-06-26: 13:00:00 via INTRAVENOUS
  Filled 2016-06-26: qty 5

## 2016-06-26 MED ORDER — FAMOTIDINE IN NACL 20-0.9 MG/50ML-% IV SOLN
INTRAVENOUS | Status: AC
  Filled 2016-06-26: qty 50

## 2016-06-26 NOTE — Patient Instructions (Signed)
[facility] Discharge Instructions for Patients Receiving Chemotherapy  Today you received the following chemotherapy agents Taxol and Carboplatin. To help prevent nausea and vomiting after your treatment, we encourage you to take your nausea medication as directed.  If you develop nausea and vomiting that is not controlled by your nausea medication, call the clinic.   BELOW ARE SYMPTOMS THAT SHOULD BE REPORTED IMMEDIATELY:  *FEVER GREATER THAN 100.5 F  *CHILLS WITH OR WITHOUT FEVER  NAUSEA AND VOMITING THAT IS NOT CONTROLLED WITH YOUR NAUSEA MEDICATION  *UNUSUAL SHORTNESS OF BREATH  *UNUSUAL BRUISING OR BLEEDING  TENDERNESS IN MOUTH AND THROAT WITH OR WITHOUT PRESENCE OF ULCERS  *URINARY PROBLEMS  *BOWEL PROBLEMS  UNUSUAL RASH Items with * indicate a potential emergency and should be followed up as soon as possible.  Feel free to call the clinic you have any questions or concerns. The clinic phone number is [phone].  Please show the CHEMO ALERT CARD at check-in to the Emergency Department and triage nurse.    

## 2016-06-27 ENCOUNTER — Ambulatory Visit: Payer: 59

## 2016-06-28 ENCOUNTER — Ambulatory Visit: Payer: 59

## 2016-06-28 NOTE — Addendum Note (Signed)
Encounter addended by: Doreen Beam, RN on: 06/28/2016 10:27 AM<BR>    Actions taken: Charge Capture section accepted

## 2016-06-29 ENCOUNTER — Ambulatory Visit (HOSPITAL_BASED_OUTPATIENT_CLINIC_OR_DEPARTMENT_OTHER): Payer: 59

## 2016-06-29 VITALS — BP 154/74 | HR 88 | Temp 98.3°F | Resp 20

## 2016-06-29 DIAGNOSIS — Z5189 Encounter for other specified aftercare: Secondary | ICD-10-CM | POA: Diagnosis not present

## 2016-06-29 DIAGNOSIS — C541 Malignant neoplasm of endometrium: Secondary | ICD-10-CM | POA: Diagnosis not present

## 2016-06-29 MED ORDER — TBO-FILGRASTIM 480 MCG/0.8ML ~~LOC~~ SOSY
480.0000 ug | PREFILLED_SYRINGE | Freq: Once | SUBCUTANEOUS | Status: AC
  Administered 2016-06-29: 480 ug via SUBCUTANEOUS
  Filled 2016-06-29: qty 0.8

## 2016-06-29 NOTE — Patient Instructions (Signed)

## 2016-06-30 ENCOUNTER — Ambulatory Visit (HOSPITAL_BASED_OUTPATIENT_CLINIC_OR_DEPARTMENT_OTHER): Payer: 59

## 2016-06-30 VITALS — BP 141/69 | HR 108 | Temp 98.5°F | Resp 20

## 2016-06-30 DIAGNOSIS — C541 Malignant neoplasm of endometrium: Secondary | ICD-10-CM | POA: Diagnosis not present

## 2016-06-30 MED ORDER — TBO-FILGRASTIM 480 MCG/0.8ML ~~LOC~~ SOSY
480.0000 ug | PREFILLED_SYRINGE | Freq: Once | SUBCUTANEOUS | Status: AC
  Administered 2016-06-30: 480 ug via SUBCUTANEOUS

## 2016-07-02 ENCOUNTER — Ambulatory Visit (HOSPITAL_BASED_OUTPATIENT_CLINIC_OR_DEPARTMENT_OTHER): Payer: 59

## 2016-07-02 VITALS — BP 147/69 | HR 97 | Temp 98.1°F | Resp 18

## 2016-07-02 DIAGNOSIS — C541 Malignant neoplasm of endometrium: Secondary | ICD-10-CM

## 2016-07-02 DIAGNOSIS — Z5189 Encounter for other specified aftercare: Secondary | ICD-10-CM

## 2016-07-02 MED ORDER — TBO-FILGRASTIM 480 MCG/0.8ML ~~LOC~~ SOSY
480.0000 ug | PREFILLED_SYRINGE | Freq: Once | SUBCUTANEOUS | Status: AC
  Administered 2016-07-02: 480 ug via SUBCUTANEOUS
  Filled 2016-07-02: qty 0.8

## 2016-07-11 ENCOUNTER — Other Ambulatory Visit: Payer: Self-pay | Admitting: Oncology

## 2016-07-12 ENCOUNTER — Ambulatory Visit (HOSPITAL_BASED_OUTPATIENT_CLINIC_OR_DEPARTMENT_OTHER): Payer: 59 | Admitting: Oncology

## 2016-07-12 ENCOUNTER — Encounter: Payer: Self-pay | Admitting: Oncology

## 2016-07-12 ENCOUNTER — Ambulatory Visit (HOSPITAL_BASED_OUTPATIENT_CLINIC_OR_DEPARTMENT_OTHER): Payer: 59

## 2016-07-12 VITALS — BP 136/62 | HR 86 | Temp 98.2°F | Resp 18 | Wt 199.4 lb

## 2016-07-12 DIAGNOSIS — G620 Drug-induced polyneuropathy: Secondary | ICD-10-CM | POA: Diagnosis not present

## 2016-07-12 DIAGNOSIS — D701 Agranulocytosis secondary to cancer chemotherapy: Secondary | ICD-10-CM | POA: Diagnosis not present

## 2016-07-12 DIAGNOSIS — I10 Essential (primary) hypertension: Secondary | ICD-10-CM | POA: Diagnosis not present

## 2016-07-12 DIAGNOSIS — Z5111 Encounter for antineoplastic chemotherapy: Secondary | ICD-10-CM

## 2016-07-12 DIAGNOSIS — C541 Malignant neoplasm of endometrium: Secondary | ICD-10-CM

## 2016-07-12 DIAGNOSIS — T451X5A Adverse effect of antineoplastic and immunosuppressive drugs, initial encounter: Secondary | ICD-10-CM

## 2016-07-12 LAB — COMPREHENSIVE METABOLIC PANEL
ALBUMIN: 3.8 g/dL (ref 3.5–5.0)
ALT: 18 U/L (ref 0–55)
ANION GAP: 8 meq/L (ref 3–11)
AST: 13 U/L (ref 5–34)
Alkaline Phosphatase: 78 U/L (ref 40–150)
BILIRUBIN TOTAL: 0.33 mg/dL (ref 0.20–1.20)
BUN: 20.2 mg/dL (ref 7.0–26.0)
CALCIUM: 9.5 mg/dL (ref 8.4–10.4)
CHLORIDE: 104 meq/L (ref 98–109)
CO2: 29 mEq/L (ref 22–29)
CREATININE: 0.8 mg/dL (ref 0.6–1.1)
EGFR: 79 mL/min/{1.73_m2} — ABNORMAL LOW (ref >90)
Glucose: 118 mg/dl (ref 70–140)
Potassium: 3.7 mEq/L (ref 3.5–5.1)
Sodium: 141 mEq/L (ref 136–145)
TOTAL PROTEIN: 6.9 g/dL (ref 6.4–8.3)

## 2016-07-12 LAB — CBC WITH DIFFERENTIAL/PLATELET
BASO%: 0.9 % (ref 0.0–2.0)
BASOS ABS: 0 10*3/uL (ref 0.0–0.1)
EOS ABS: 0.1 10*3/uL (ref 0.0–0.5)
EOS%: 2.8 % (ref 0.0–7.0)
HEMATOCRIT: 29.6 % — AB (ref 34.8–46.6)
HEMOGLOBIN: 10 g/dL — AB (ref 11.6–15.9)
LYMPH%: 19.9 % (ref 14.0–49.7)
MCH: 32.9 pg (ref 25.1–34.0)
MCHC: 33.9 g/dL (ref 31.5–36.0)
MCV: 97.3 fL (ref 79.5–101.0)
MONO#: 0.3 10*3/uL (ref 0.1–0.9)
MONO%: 8.7 % (ref 0.0–14.0)
NEUT#: 2 10*3/uL (ref 1.5–6.5)
NEUT%: 67.7 % (ref 38.4–76.8)
Platelets: 99 10*3/uL — ABNORMAL LOW (ref 145–400)
RBC: 3.05 10*6/uL — ABNORMAL LOW (ref 3.70–5.45)
RDW: 16.5 % — ABNORMAL HIGH (ref 11.2–14.5)
WBC: 3 10*3/uL — ABNORMAL LOW (ref 3.9–10.3)
lymph#: 0.6 10*3/uL — ABNORMAL LOW (ref 0.9–3.3)

## 2016-07-12 NOTE — Progress Notes (Signed)
OFFICE PROGRESS NOTE   July 12, 2016   Physicians: Charlene Brooke, Nickola Major, DO, Gery Pray, Marylynn Pearson, (M.Wert)  INTERVAL HISTORY:  Patient is seen, alone for visit, as she continues adjuvant chemotherapy for IIIC1 high grade endometrial carcinoma. She had initial IMRT with CDDP days 1 and 28, vaginal brachytherapy, and will have cycle 4 carbo taxol on 07-17-16, which is last planned chemo. Peripheral neuropathy in feet has worsened since cycle 3 to the point that she cannot feel hot bath water and cannot walk in slippers, so will give cycle 4 as carboplatin only. She had granix 480 mgc x 3 after cycle 3.  Peripheral neuropathy in feet as above. Some shoes more comfortable and secure, discussed. Some numbness also in thumbs and first fingers bilaterally, not interfering with activity. Energy and appetite ok, did miss work x1 day for first time after cycle 3. No nausea, bowels ok, no SOB or productive cough, no new or different pain. No fever or symptoms of infection. No bleeding.  Remainder of 10 point Review of Systems negative.    Flu vaccine 06-21-16 No central catheter MSI high. Genetics counseling to be rescheduled after chemo completed per patient's request Overdue mammograms, which she agrees to have done after chemo Never colonoscopy (see MSI high above)  Family planning beach trip for Thanksgiving  ONCOLOGIC HISTORY Patient had postmenopausal spotting 09-2014, with gyn exam and PAP then negative. She had 3 days of spotting in Sept 2016, then slightly heavier spotting in 12-2015. US showed thickened endometrial stripe with normal size uterus, and 1.3 cm dermoid on left ovary. Hysteroscopy D&C 12-27-15 found grade 1 endometrial carcinoma. She was referred to Dr Denman George, with robotic assisted total hysterectomy with BSO and sentinel node evaluation done 01-19-16. Pathology [phone]) found grade 3 endometrial adenocarcinoma (poorly differentiated arising from well differentiated  adenocarcinoma), 7.2 cm tumor invading into outer half of myometrium with benign cervix, tubes, ovaries. No definite LVSI. 1 of 8 right pelvic nodes with isolated tumor cell, 1 left pelvic node + in addition to 1 left pelvic node with isolated tumor cells and 4 left pelvic nodes negative. Patient was DC home within 24 hours, no prophylactic lovenox at DC.  GIven grade 3 pathology, she had CT CAP 02-02-16 on 02-02-16, with no clear distant disease and no obvious residual pelvic disease. Recommendation is for pelvic radiation with CDDP days 1 and 28, then 4 cycles of carboplatin taxol chemotherapy. First CDDP was given 03-05-16 and second on 04-05-16. She had first carbo taxol on 05-11-16, neutropenic with ANC 0.2 by day 11 cycle 1, granix added. Peripheral neuropathy in feet significant after cycle 3 to point that taxol held for cycle 4.  Objective:  Vital signs in last 24 hours:  BP 136/62 (BP Location: Left Arm, Patient Position: Sitting)   Pulse 86   Temp 98.2 F (36.8 C) (Oral)   Resp 18   Wt 199 lb 6.4 oz (90.4 kg)   SpO2 100%   BMI 32.18 kg/m  Weight up 3 lbs. Alert, oriented and appropriate. Ambulatory without difficulty wearing shoes with low heels Alopecia  HEENT:PERRL, sclerae not icteric. Oral mucosa moist without lesions, posterior pharynx clear.  Neck supple. No JVD.  Lymphatics:no cervical,supraclavicular, axillary or inguinal adenopathy Resp: clear to auscultation bilaterally and normal percussion bilaterally Cardio: regular rate and rhythm. No gallop. GI: soft, nontender, not distended, no mass or organomegaly. Normally active bowel sounds. Surgical incision not remarkable. Musculoskeletal/ Extremities: without pitting edema, cords, tenderness Neuro: peripheral neuropathy  feet bilaterally, thumbs and first fingers as described.. Otherwise nonfocal. PSYCH cheerful mood and affect Skin without rash, ecchymosis, petechiae   Lab Results:  Results for orders placed or performed  in visit on 07/12/16  CBC with Differential  Result Value Ref Range   WBC 3.0 (L) 3.9 - 10.3 10e3/uL   NEUT# 2.0 1.5 - 6.5 10e3/uL   HGB 10.0 (L) 11.6 - 15.9 g/dL   HCT 29.6 (L) 34.8 - 46.6 %   Platelets 99 (L) 145 - 400 10e3/uL   MCV 97.3 79.5 - 101.0 fL   MCH 32.9 25.1 - 34.0 pg   MCHC 33.9 31.5 - 36.0 g/dL   RBC 3.05 (L) 3.70 - 5.45 10e6/uL   RDW 16.5 (H) 11.2 - 14.5 %   lymph# 0.6 (L) 0.9 - 3.3 10e3/uL   MONO# 0.3 0.1 - 0.9 10e3/uL   Eosinophils Absolute 0.1 0.0 - 0.5 10e3/uL   Basophils Absolute 0.0 0.0 - 0.1 10e3/uL   NEUT% 67.7 38.4 - 76.8 %   LYMPH% 19.9 14.0 - 49.7 %   MONO% 8.7 0.0 - 14.0 %   EOS% 2.8 0.0 - 7.0 %   BASO% 0.9 0.0 - 2.0 %  Comprehensive metabolic panel  Result Value Ref Range   Sodium 141 136 - 145 mEq/L   Potassium 3.7 3.5 - 5.1 mEq/L   Chloride 104 98 - 109 mEq/L   CO2 29 22 - 29 mEq/L   Glucose 118 70 - 140 mg/dl   BUN 20.2 7.0 - 26.0 mg/dL   Creatinine 0.8 0.6 - 1.1 mg/dL   Total Bilirubin 0.33 0.20 - 1.20 mg/dL   Alkaline Phosphatase 78 40 - 150 U/L   AST 13 5 - 34 U/L   ALT 18 0 - 55 U/L   Total Protein 6.9 6.4 - 8.3 g/dL   Albumin 3.8 3.5 - 5.0 g/dL   Calcium 9.5 8.4 - 10.4 mg/dL   Anion Gap 8 3 - 11 mEq/L   EGFR 79 (L) >90 ml/min/1.73 m2   CBC reviewed at time of visit. Expect counts will increase further prior to treatment on 07-17-16, will recheck that day.  Studies/Results:  No results found.  Medications: I have reviewed the patient's current medications. No decadron oral premedication for cycle 4 as holding taxol Add B6 or B complex daily  DISCUSSION  Peripheral neuropathy discussed, this primarily from taxol. Has rapidly worsened since cycle 3 chemo, cannot give further taxane with symptoms now. Encouraged shoes with support, careful ambulation, lots of movement and massage, B vitamins as above. Hopefully this will improve out from chemo, tho may not resolve entirely. Not painful and not interfering with sleep particularly, so  have not added gabapentin etc for symptoms now.  Patient understands that she will have restaging scans prior to seeing Dr Denman George, that MD appointment set up now for 08-27-16 and CT AP ordered shortly prior.  She agrees to genetics counseling, requested late Nov or early Dec. Mentioned mammograms and colonoscopy (timing of latter per rad onc/ gyn onc)  Assessment/Plan:  1.IIIC1 poorly differentiated endometrial adenocarcinoma arising in well differentiated endometrial adenocarcinoma: post complete debulking by Dr Denman George 01-19-16, pelvic radiation with CDDP x2, HDR. Patient FTKA several appointments, then began [place] taxol 11-16-43, complicated thus far by neutropenia by day 11 cycle 1,moderate taxol aches and now significant peripheral neuropathy in feet. Will give last planned cycle 4 as carboplatin only on 07-17-16 as long as ANC >=1.5 and plt >=100k that day, with same granix.  High MSI,genetics counseling still needed. 2 chemo peripheral neuropathy: severe in feet since cycle 3, unable to feel hot water in tub and having some difficulty walking unless correct shoes. No taxol cycle 4. 3.long minimal tobacco abuse: counseled for tobacco cessation, will continue to address 4.overdue mammograms which we will address after completion of treatment. 5.post cholecystectomy and lumbar disc surgery x2 6.no colonoscopy, which will also be addressed as possible 7.environmental allergies 8.benign arrhythmias on Holter 2014 9. flu vaccine 06-21-16.  10.HTN: recently begun on low dose thiazide diuretic. Potassium WNL today    All questions answered and patient is in agreement with plans above. Chemo orders for cycle 4 changed, granix confirmed. Appointment with gyn onc scheduled and CT coordinated. Time spent 25 min including >50% counseling and coordination of care.  Route PCP, cc Drs Denman George, Delena Serve, MD   07/12/2016, 4:26 PM

## 2016-07-17 ENCOUNTER — Ambulatory Visit (HOSPITAL_BASED_OUTPATIENT_CLINIC_OR_DEPARTMENT_OTHER): Payer: 59

## 2016-07-17 ENCOUNTER — Other Ambulatory Visit (HOSPITAL_BASED_OUTPATIENT_CLINIC_OR_DEPARTMENT_OTHER): Payer: 59

## 2016-07-17 VITALS — BP 151/93 | HR 80 | Temp 98.6°F | Resp 18

## 2016-07-17 DIAGNOSIS — Z5111 Encounter for antineoplastic chemotherapy: Secondary | ICD-10-CM

## 2016-07-17 DIAGNOSIS — C541 Malignant neoplasm of endometrium: Secondary | ICD-10-CM

## 2016-07-17 LAB — CBC WITH DIFFERENTIAL/PLATELET
BASO%: 1.7 % (ref 0.0–2.0)
Basophils Absolute: 0.1 10*3/uL (ref 0.0–0.1)
EOS ABS: 0.1 10*3/uL (ref 0.0–0.5)
EOS%: 2.4 % (ref 0.0–7.0)
HCT: 31.5 % — ABNORMAL LOW (ref 34.8–46.6)
HGB: 10.7 g/dL — ABNORMAL LOW (ref 11.6–15.9)
LYMPH%: 23.3 % (ref 14.0–49.7)
MCH: 32.3 pg (ref 25.1–34.0)
MCHC: 34 g/dL (ref 31.5–36.0)
MCV: 95.2 fL (ref 79.5–101.0)
MONO#: 0.3 10*3/uL (ref 0.1–0.9)
MONO%: 11.1 % (ref 0.0–14.0)
NEUT%: 61.5 % (ref 38.4–76.8)
NEUTROS ABS: 1.8 10*3/uL (ref 1.5–6.5)
NRBC: 0 % (ref 0–0)
PLATELETS: 126 10*3/uL — AB (ref 145–400)
RBC: 3.31 10*6/uL — AB (ref 3.70–5.45)
RDW: 15.7 % — AB (ref 11.2–14.5)
WBC: 2.9 10*3/uL — AB (ref 3.9–10.3)
lymph#: 0.7 10*3/uL — ABNORMAL LOW (ref 0.9–3.3)

## 2016-07-17 MED ORDER — SODIUM CHLORIDE 0.9 % IV SOLN
Freq: Once | INTRAVENOUS | Status: AC
  Administered 2016-07-17: 12:00:00 via INTRAVENOUS

## 2016-07-17 MED ORDER — ONDANSETRON HCL 8 MG PO TABS
ORAL_TABLET | ORAL | Status: AC
  Filled 2016-07-17: qty 1

## 2016-07-17 MED ORDER — ONDANSETRON HCL 8 MG PO TABS
8.0000 mg | ORAL_TABLET | Freq: Once | ORAL | Status: AC
  Administered 2016-07-17: 8 mg via ORAL

## 2016-07-17 MED ORDER — CARBOPLATIN CHEMO INJECTION 600 MG/60ML
750.0000 mg | Freq: Once | INTRAVENOUS | Status: AC
  Administered 2016-07-17: 750 mg via INTRAVENOUS
  Filled 2016-07-17: qty 75

## 2016-07-17 MED ORDER — SODIUM CHLORIDE 0.9 % IV SOLN
Freq: Once | INTRAVENOUS | Status: AC
  Administered 2016-07-17: 12:00:00 via INTRAVENOUS
  Filled 2016-07-17: qty 5

## 2016-07-17 NOTE — Patient Instructions (Signed)
[facility] Discharge Instructions for Patients Receiving Chemotherapy  Today you received the following chemotherapy agents: Carboplatin   To help prevent nausea and vomiting after your treatment, we encourage you to take your nausea medication as directed.    If you develop nausea and vomiting that is not controlled by your nausea medication, call the clinic.   BELOW ARE SYMPTOMS THAT SHOULD BE REPORTED IMMEDIATELY:  *FEVER GREATER THAN 100.5 F  *CHILLS WITH OR WITHOUT FEVER  NAUSEA AND VOMITING THAT IS NOT CONTROLLED WITH YOUR NAUSEA MEDICATION  *UNUSUAL SHORTNESS OF BREATH  *UNUSUAL BRUISING OR BLEEDING  TENDERNESS IN MOUTH AND THROAT WITH OR WITHOUT PRESENCE OF ULCERS  *URINARY PROBLEMS  *BOWEL PROBLEMS  UNUSUAL RASH Items with * indicate a potential emergency and should be followed up as soon as possible.  Feel free to call the clinic you have any questions or concerns. The clinic phone number is [phone].  Please show the CHEMO ALERT CARD at check-in to the Emergency Department and triage nurse.   

## 2016-07-18 ENCOUNTER — Telehealth: Payer: Self-pay | Admitting: *Deleted

## 2016-07-18 ENCOUNTER — Other Ambulatory Visit: Payer: Self-pay | Admitting: Oncology

## 2016-07-18 NOTE — Telephone Encounter (Signed)
Pt called wanting to know what chemo pt received yesterday that prevented pt from going to sleep.  Stated she was not able to fall asleep until 430 am today.   Called pt back at work phone - per pt's request.   Left message on voice mail asking pt to call office back for further details. Pt's   Work phone     [phone].

## 2016-07-19 ENCOUNTER — Telehealth: Payer: Self-pay

## 2016-07-19 ENCOUNTER — Other Ambulatory Visit: Payer: Self-pay

## 2016-07-19 ENCOUNTER — Other Ambulatory Visit: Payer: Self-pay | Admitting: Oncology

## 2016-07-19 ENCOUNTER — Ambulatory Visit (HOSPITAL_BASED_OUTPATIENT_CLINIC_OR_DEPARTMENT_OTHER): Payer: 59

## 2016-07-19 VITALS — BP 156/70 | HR 67 | Temp 98.3°F | Resp 20

## 2016-07-19 DIAGNOSIS — Z5189 Encounter for other specified aftercare: Secondary | ICD-10-CM | POA: Diagnosis not present

## 2016-07-19 DIAGNOSIS — C541 Malignant neoplasm of endometrium: Secondary | ICD-10-CM

## 2016-07-19 MED ORDER — LORAZEPAM 1 MG PO TABS: 1.0000 mg | ORAL_TABLET | Freq: Every day | ORAL | 0 refills | Status: DC

## 2016-07-19 MED ORDER — TBO-FILGRASTIM 480 MCG/0.8ML ~~LOC~~ SOSY
480.0000 ug | PREFILLED_SYRINGE | Freq: Once | SUBCUTANEOUS | Status: AC
  Administered 2016-07-19: 480 ug via SUBCUTANEOUS
  Filled 2016-07-19: qty 0.8

## 2016-07-19 NOTE — Patient Instructions (Signed)

## 2016-07-19 NOTE — Telephone Encounter (Signed)
-----   Message from Gordy Levan, MD sent at 07/18/2016  4:49 PM EST ----- Triage note seen: Pt called wanting to know what chemo pt received yesterday that prevented pt from going to sleep.  Stated she was not able to fall asleep until 430 am today.   Called pt back at work phone - per pt's request.   Left message on voice mail asking pt to call office back for further details. Pt's   Work phone     [phone].  MD reply: She had [place] only cycle 4 Please let patient know  that she did have some decadron (with zofran and emend IV premeds. Probably the decadron caused difficulty sleeping. She did not have benadryl as no taxol, so maybe more difficulty sleeping without the benadryl.  Luckily that was the last planned chemo.  thanks

## 2016-07-19 NOTE — Telephone Encounter (Signed)
lvm per dr Mariana Kaufman attached message on her cell and work phones

## 2016-07-20 ENCOUNTER — Ambulatory Visit (HOSPITAL_BASED_OUTPATIENT_CLINIC_OR_DEPARTMENT_OTHER): Payer: 59

## 2016-07-20 VITALS — BP 151/65 | HR 81 | Temp 98.4°F | Resp 18

## 2016-07-20 DIAGNOSIS — Z5189 Encounter for other specified aftercare: Secondary | ICD-10-CM

## 2016-07-20 DIAGNOSIS — C541 Malignant neoplasm of endometrium: Secondary | ICD-10-CM

## 2016-07-20 MED ORDER — TBO-FILGRASTIM 480 MCG/0.8ML ~~LOC~~ SOSY
480.0000 ug | PREFILLED_SYRINGE | Freq: Once | SUBCUTANEOUS | Status: AC
  Administered 2016-07-20: 480 ug via SUBCUTANEOUS
  Filled 2016-07-20: qty 0.8

## 2016-07-20 NOTE — Patient Instructions (Signed)

## 2016-07-21 ENCOUNTER — Ambulatory Visit (HOSPITAL_BASED_OUTPATIENT_CLINIC_OR_DEPARTMENT_OTHER): Payer: 59

## 2016-07-21 VITALS — BP 163/85 | HR 93 | Temp 99.2°F | Resp 18

## 2016-07-21 DIAGNOSIS — C541 Malignant neoplasm of endometrium: Secondary | ICD-10-CM | POA: Diagnosis not present

## 2016-07-21 DIAGNOSIS — D701 Agranulocytosis secondary to cancer chemotherapy: Secondary | ICD-10-CM

## 2016-07-21 MED ORDER — TBO-FILGRASTIM 480 MCG/0.8ML ~~LOC~~ SOSY
480.0000 ug | PREFILLED_SYRINGE | Freq: Once | SUBCUTANEOUS | Status: AC
  Administered 2016-07-21: 480 ug via SUBCUTANEOUS

## 2016-07-21 NOTE — Patient Instructions (Signed)

## 2016-07-23 ENCOUNTER — Ambulatory Visit: Payer: 59

## 2016-08-05 ENCOUNTER — Other Ambulatory Visit: Payer: Self-pay | Admitting: Oncology

## 2016-08-05 ENCOUNTER — Encounter: Payer: Self-pay | Admitting: Oncology

## 2016-08-05 DIAGNOSIS — C541 Malignant neoplasm of endometrium: Secondary | ICD-10-CM

## 2016-08-05 NOTE — Progress Notes (Signed)
[facility] END OF TREATMENT   Name: Amy Holmes Date: August 05, 2016  MRN: [id number] DOB: [birth year]   TREATMENT DATES: 05-11-16 thru 07-17-16   REFERRING PHYSICIAN: Everitt Amber  DIAGNOSIS: high grade endometrial carcinoma   STAGE AT START OF TREATMENT: IIIC1   INTENT: curative   DRUGS OR REGIMENS GIVEN: carbo taxol   MAJOR TOXICITIES: neutropenia, peripheral neuropathy, taxol aches   REASON TREATMENT STOPPED: completion of planned course   PERFORMANCE STATUS AT END: 1   ONGOING PROBLEMS: peripheral neuropathy   FOLLOW UP PLANS: restaging CT and reevaluation by gyn oncology 08-2016. Genetics evaluation

## 2016-08-09 ENCOUNTER — Other Ambulatory Visit: Payer: 59

## 2016-08-09 ENCOUNTER — Ambulatory Visit: Payer: 59 | Admitting: Oncology

## 2016-08-21 ENCOUNTER — Telehealth: Payer: Self-pay

## 2016-08-21 NOTE — Telephone Encounter (Signed)
Pt called asking what her appt with [name] was about. Called back and LVM that [name] is our genetics clinic. If she has further questions to call us back.

## 2016-08-22 ENCOUNTER — Other Ambulatory Visit: Payer: 59

## 2016-08-22 ENCOUNTER — Encounter: Payer: 59 | Admitting: Genetic Counselor

## 2016-08-24 ENCOUNTER — Other Ambulatory Visit: Payer: Self-pay

## 2016-08-27 ENCOUNTER — Ambulatory Visit: Payer: 59 | Attending: Gynecologic Oncology | Admitting: Gynecologic Oncology

## 2016-08-28 ENCOUNTER — Telehealth: Payer: Self-pay | Admitting: Nurse Practitioner

## 2016-08-28 NOTE — Telephone Encounter (Signed)
Patient calling to acknowledge that she missed her apt on 08/27/16 with Dr. Denman George due to being unexpectedly out of town to tend to her father. She is apologetic. I rescheduled patient to 09/21/16 at 11:45 am with Dr. Denman George. She is also aware of a CT scan on 09/05/16 at 8 am at [facility]. Patient denies issues or concerns at this time.

## 2016-09-05 ENCOUNTER — Ambulatory Visit (HOSPITAL_COMMUNITY)
Admission: RE | Admit: 2016-09-05 | Discharge: 2016-09-05 | Disposition: A | Discharge status: Home / Self Care | Payer: 59 | Source: Ambulatory Visit | Attending: Oncology | Admitting: Oncology

## 2016-09-05 ENCOUNTER — Encounter (HOSPITAL_COMMUNITY): Payer: Self-pay

## 2016-09-05 DIAGNOSIS — C541 Malignant neoplasm of endometrium: Secondary | ICD-10-CM | POA: Diagnosis present

## 2016-09-05 DIAGNOSIS — Z923 Personal history of irradiation: Secondary | ICD-10-CM | POA: Insufficient documentation

## 2016-09-05 DIAGNOSIS — K760 Fatty (change of) liver, not elsewhere classified: Secondary | ICD-10-CM | POA: Diagnosis not present

## 2016-09-05 LAB — POCT I-STAT CREATININE: CREATININE: 0.7 mg/dL (ref 0.44–1.00)

## 2016-09-05 MED ORDER — IOPAMIDOL (ISOVUE-300) INJECTION 61%
100.0000 mL | Freq: Once | INTRAVENOUS | Status: AC | PRN
  Administered 2016-09-05: 100 mL via INTRAVENOUS

## 2016-09-05 MED ORDER — IOPAMIDOL (ISOVUE-300) INJECTION 61%
INTRAVENOUS | Status: AC
  Filled 2016-09-05: qty 100

## 2016-09-20 NOTE — Progress Notes (Deleted)
Follow-up Note: Gyn-Onc  CC:  No chief complaint on file.   Assessment/Plan:  [name]  is a 61 y.o.  year old with stage IIIC1 grade 3 endometrioid endometrial adenocarcinoma with positive left pelvic lymph node (macrometastasis), treated with external beam radiation and cisplatin on days 1 and 28 followed by carboplatin and paclitaxel x 4 cycles (completd ***).  Complete response with no evidence of residual or progressive disease.  Recommend follow-up with Dr Sondra Come in *** and with me in ***.  HPI: Amy Holmes is a 61 year old G3P3 who is seen in consultation at the request of Dr Julien Girt for grade 1 endometrial cancer. The patient had her first episode of postmenopausal bleeding in January 2016. This was worked up with a pap smear that was normal. She had another episode of spotting in 2016, and then again in March, 2017. At that time she presented to Dr Julien Girt office who performed an [place] which showed a normal size uterus but thickened endometrial stripe. A 1.3cm dermoid was identified on the left ovary.   A hysteroscopy and D&C was performed on 12/27/15 which showed grade 1 endometrial cancer.   On 01/19/16 she underwent a robotic hysterectomy, BSO, SLN biopsy. She mapped only to the right pelvic nodes and a left completion lymphadenectomy was performed.  Final pathology revealed a stage IIIC1 grade 3 endometrial cancer (arising in a well differentiated cancer) with a 7.2cm tumor size, 1.3 of 2.4cm myometrial invasion (outer half) and no LVSI. The right SLN was negative but the left pelvic lymph node was positive for metastasis.   Postoperative imaging with CT chest abdo pelvis was performed on 02/02/16 and revealed a small left sided obturator lymphocyst but no evidence of metastatic disease. She was recommended to have adjuvant therapy with external beam radiation and chemotherapy in accordance with NCCN guidelines.  Radiation treatment dates:   03/01/16 - 05/08/16  Site/dose:  1)  Pelvis: 45 Gy in 25 fractions 2) Vaginal Cuff: 18 Gy in 3 fractions Chemotherapy treatment dates: *** - ***. # cycles: 4.   Interval Hx: Post-treatment CT scan on 09/05/16 showed no measurable disease. It did show colonic and bladder thickening consistent with radiation treatment effect. ***.  Current Meds:  Outpatient Encounter Prescriptions as of 09/21/2016  Medication Sig  . aspirin EC 81 MG tablet Take 81 mg by mouth daily.  Marland Kitchen CALCIUM CARBONATE PO Take 1 tablet by mouth daily.  . Coenzyme Q10 100 MG TABS Take 100 mg by mouth daily.  Marland Kitchen dexamethasone (DECADRON) 4 MG tablet Take 5 tabs with food= 20 mg  12 hrs and 6 hrs prior to Taxol Chemotherapy. (Patient not taking: Reported on 07/12/2016)  . diphenoxylate-atropine (LOMOTIL) 2.5-0.025 MG tablet Take 2 tablets by mouth 4 (four) times daily as needed for diarrhea or loose stools. (Patient not taking: Reported on 07/12/2016)  . Doxylamine Succinate, Sleep, (SLEEP AID PO) Take 1 tablet by mouth at bedtime as needed.  . hydrochlorothiazide (HYDRODIURIL) 12.5 MG tablet Take 1 tablet (12.5 mg total) by mouth daily.  Marland Kitchen loperamide (IMODIUM A-D) 2 MG tablet Take 2 mg by mouth 4 (four) times daily as needed for diarrhea or loose stools.  Marland Kitchen LORazepam (ATIVAN) 1 MG tablet Take 1 tablet (1 mg total) by mouth daily. For severe anxiety. Will make you drowsy.  . traMADol (ULTRAM) 50 MG tablet Take 1 tablet (50 mg total) by mouth every 6 (six) hours as needed for severe pain. (Patient not taking: Reported on 07/12/2016)  .  vitamin C (ASCORBIC ACID) 500 MG tablet Take 500 mg by mouth daily.   No facility-administered encounter medications on file as of 09/21/2016.     Allergy: No Known Allergies  Social Hx:   Social History   Social History  . Marital status: Divorced    Spouse name: N/A  . Number of children: 3  . Years of education: N/A   Occupational History  . HR     Social History Main Topics  . Smoking status: Current Every Day Smoker     Packs/day: 0.25    Years: 20.00    Types: Cigarettes    Last attempt to quit: 08/10/2014  . Smokeless tobacco: Never Used     Comment: 3 cig per day   . Alcohol use 0.0 oz/week     Comment: 1 drink here and there  . Drug use: No  . Sexual activity: Not on file   Other Topics Concern  . Not on file   Social History Narrative   Work or School:      Home Situation:      Spiritual Beliefs:      Lifestyle:             Past Surgical Hx:  Past Surgical History:  Procedure Laterality Date  . CHOLECYSTECTOMY  2007  . LUMBAR DISC SURGERY     x 2  . LYMPH NODE DISSECTION N/A 01/19/2016   Procedure: sentienal LYMPH NODE mapping, bilateral pelvic lymph node dissection;  Surgeon: Everitt Amber, MD;  Location: [facility];  Service: Gynecology;  Laterality: N/A;  . ROBOTIC ASSISTED TOTAL HYSTERECTOMY WITH BILATERAL SALPINGO OOPHERECTOMY N/A 01/19/2016   Procedure: XI ROBOTIC ASSISTED TOTAL HYSTERECTOMY WITH BILATERAL SALPINGO OOPHORECTOMY;  Surgeon: Everitt Amber, MD;  Location: [facility];  Service: Gynecology;  Laterality: N/A;  . TUBAL LIGATION      Past Medical Hx:  Past Medical History:  Diagnosis Date  . [id number] 12/2015   endometrial  . Anxiety and depression    situational  . Essential hypertension 06/09/2016  . History of radiation therapy 03/01/16-05/08/16   pelvis 45 Gy, HDR vaginal brachytherapy 18 Gy  . Insomnia   . Obesity   . Palpitations    2 months ago, stopped with energy drinks stopped  . Tobacco abuse     Past Gynecological History:  SVD x 3  No LMP recorded. Patient has had a hysterectomy.  Family Hx:  Family History  Problem Relation Age of Onset  . Heart disease Mother   . Diabetes Mother   . Depression Maternal Grandmother   . Depression Maternal Grandfather   . Cancer Maternal Grandfather     unknown type  . Asthma Son   . Asthma Daughter     Review of Systems:  Constitutional  Feels well,    ENT Normal appearing ears and nares bilaterally Skin/Breast   No rash, sores, jaundice, itching, dryness Cardiovascular  No chest pain, shortness of breath, or edema  Pulmonary  No cough or wheeze.  Gastro Intestinal  No nausea, vomitting, or diarrhoea. No bright red blood per rectum, no abdominal pain, change in bowel movement, or constipation.  Genito Urinary  No frequency, urgency, dysuria, + postmenopausal bleeding Musculo Skeletal  No myalgia, arthralgia, joint swelling or pain  Neurologic  No weakness, numbness, change in gait,  Psychology  No depression, anxiety, insomnia.   Vitals:  There were no vitals taken for this visit.  Physical Exam: WD in NAD Neck  Supple NROM, without any  enlargements.  Lymph Node Survey No cervical supraclavicular or inguinal adenopathy Cardiovascular  Pulse normal rate, regularity and rhythm. S1 and S2 normal.  Lungs  Clear to auscultation bilateraly, without wheezes/crackles/rhonchi. Good air movement.  Skin No rashes. Psychiatry  Alert and oriented to person, place, and time  Abdomen  Normoactive bowel sounds, abdomen soft, non-tender and overweight without evidence of hernia. Well healed incisions.  Back No CVA tenderness Genito Urinary  Normal vagina, cuff healing normally, no blood, surgically absent uterus and cervix. No masses Rectal  Good tone, no masses no cul de sac nodularity.  Extremities  No bilateral cyanosis, clubbing or edema.   30 minutes of direct face to face counseling time was spent with the patient. This included discussion about prognosis, therapy recommendations and postoperative side effects and are beyond the scope of routine postoperative care.  Donaciano Eva, MD  09/20/2016, 11:31 PM

## 2016-09-21 ENCOUNTER — Ambulatory Visit: Payer: 59 | Admitting: Gynecologic Oncology

## 2016-10-03 IMAGING — CT CT CHEST W/ CM
3 of 6 series · 16 of 46 positions shown, 18 images · IV contrast (ISOVUE)
Comparison: CT 10/03/2008

CLINICAL DATA: Endometrial carcinoma diagnosed 1 month prior.
Chemotherapy and radiation therapy planned. Initial evaluation.

EXAM:
CT CHEST, ABDOMEN, AND PELVIS WITH CONTRAST
TECHNIQUE: Multidetector CT imaging of the chest, abdomen and pelvis was
performed following the standard protocol during bolus
administration of intravenous contrast.
CONTRAST:  100mL WQBNM4-ILL IOPAMIDOL (WQBNM4-ILL) INJECTION 61%

[Series 2: cap with st · axial · 0.78mm/px · z∈[-590,-60]mm · 10 of 130 slices shown, 12 images]
[im 12/130  soft-tissue]
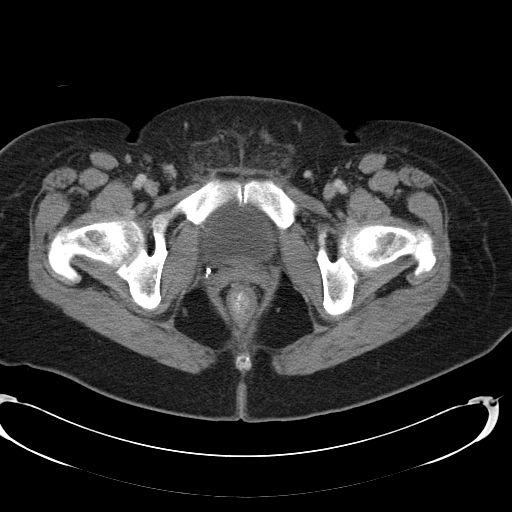
[im 12/130  bone]
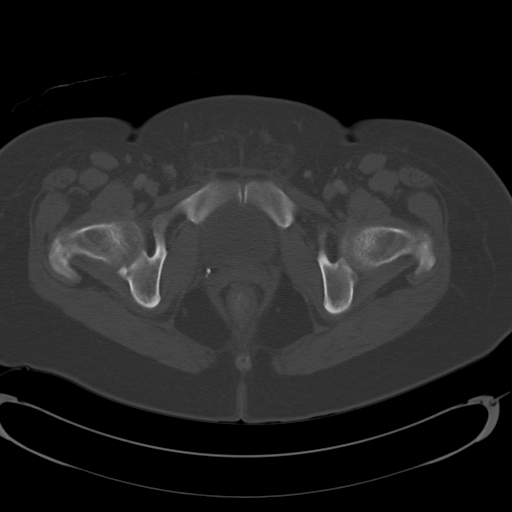
[im 24/130  soft-tissue]
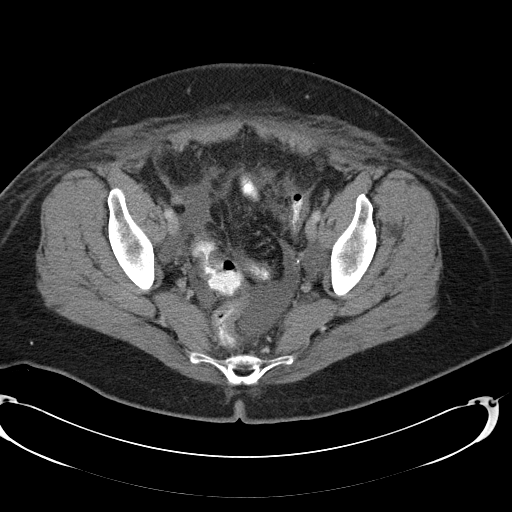
[im 36/130  soft-tissue]
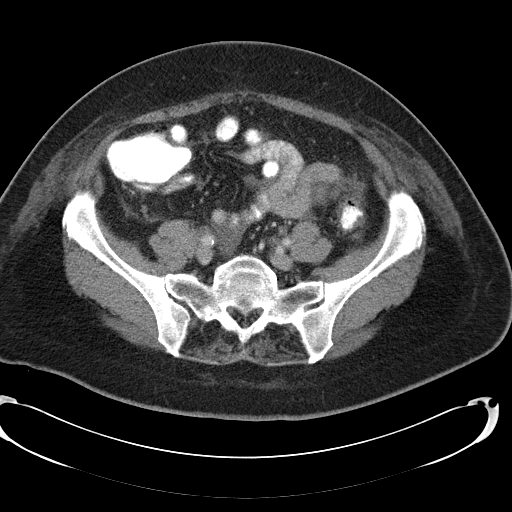
[im 47/130  soft-tissue]
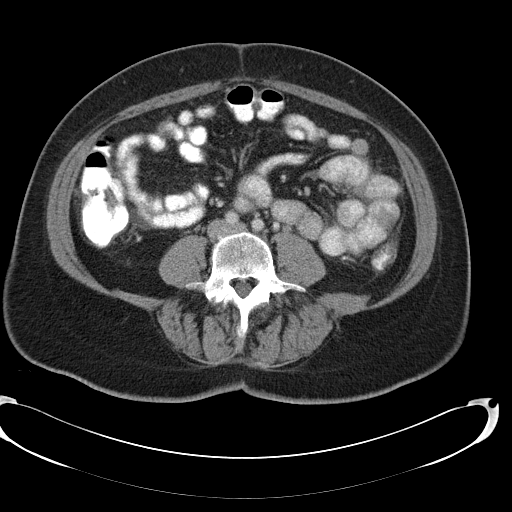
[im 59/130  soft-tissue]
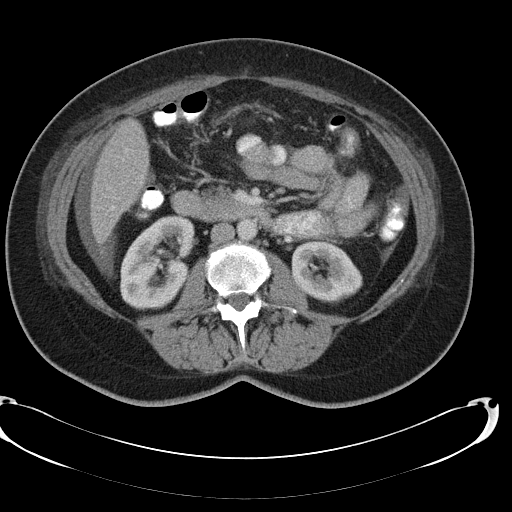
[im 71/130  soft-tissue]
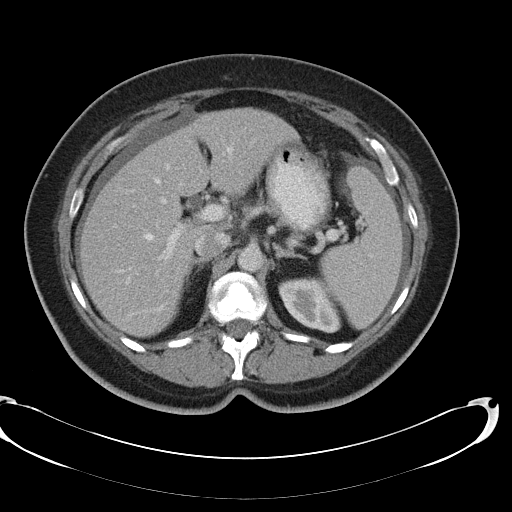
[im 83/130  soft-tissue]
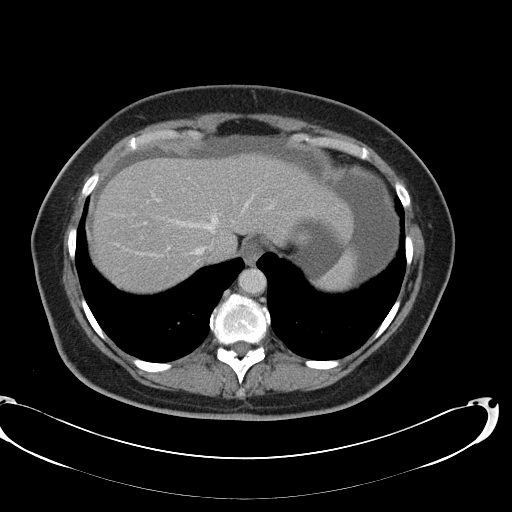
[im 94/130  soft-tissue]
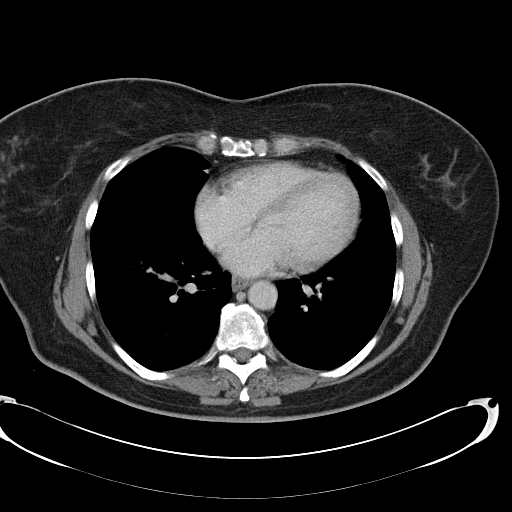
[im 106/130  soft-tissue]
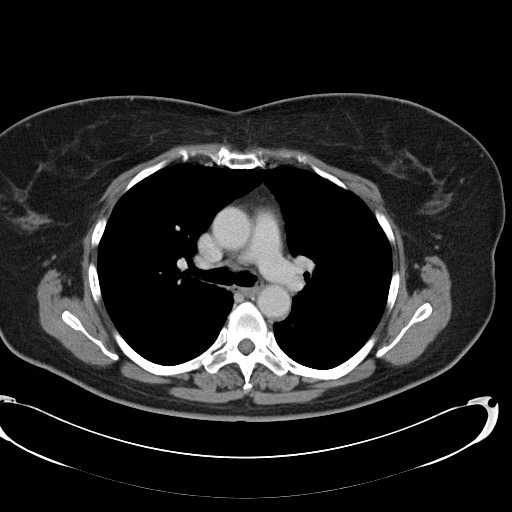
[im 106/130  bone]
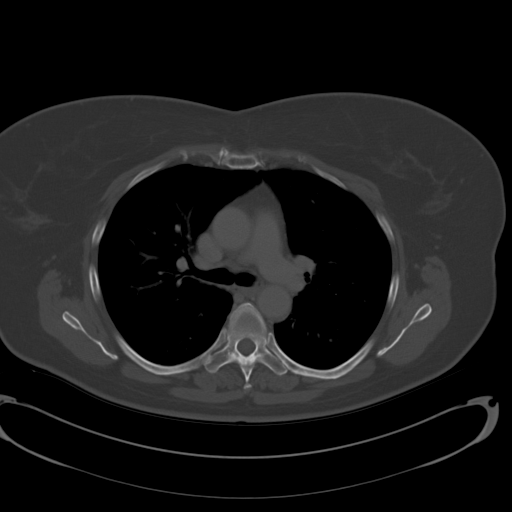
[im 118/130  soft-tissue]
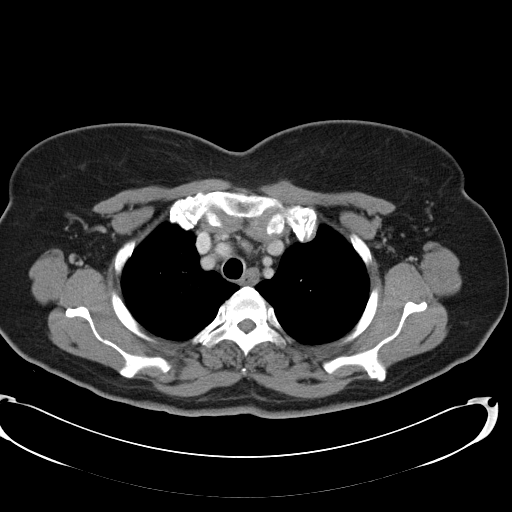

[Series 4: lung windows · axial · 0.78mm/px · z∈[-264,-198]mm · 3 of 143 slices shown]
[im 11/143  bone]
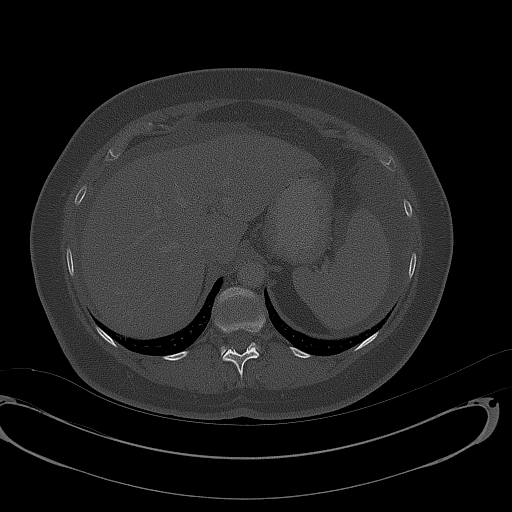
[im 33/143  bone]
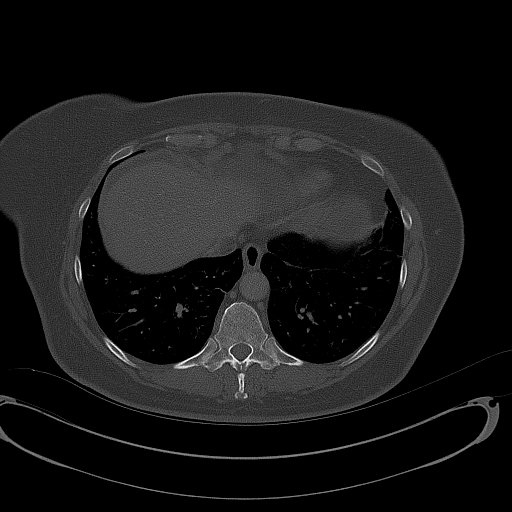
[im 44/143  bone]
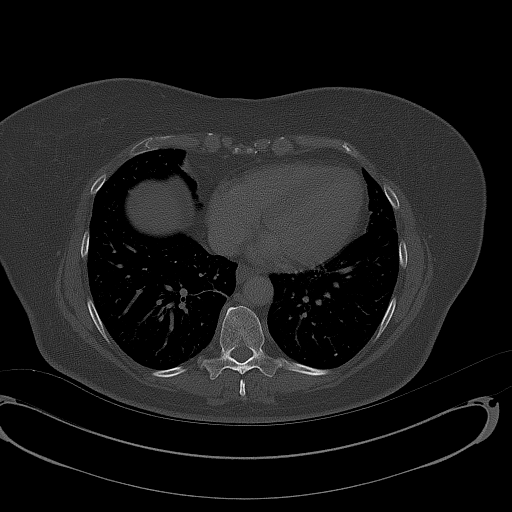

[Series 602: coronal images · coronal · 1.26mm/px · 3 of 151 slices shown]
[im 51/151  soft-tissue]
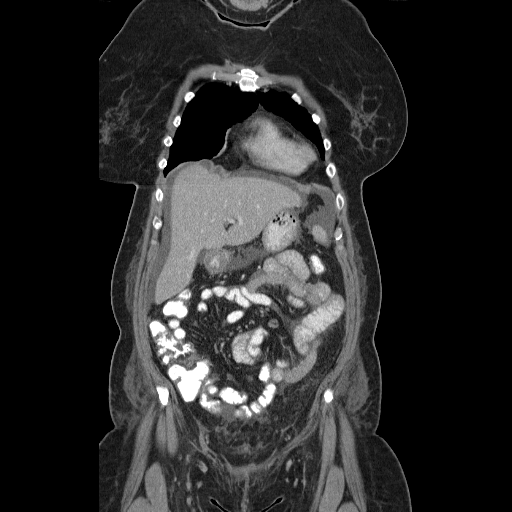
[im 67/151  soft-tissue]
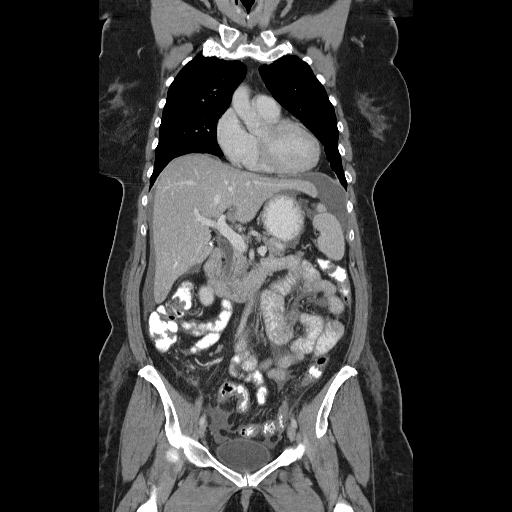
[im 84/151  soft-tissue]
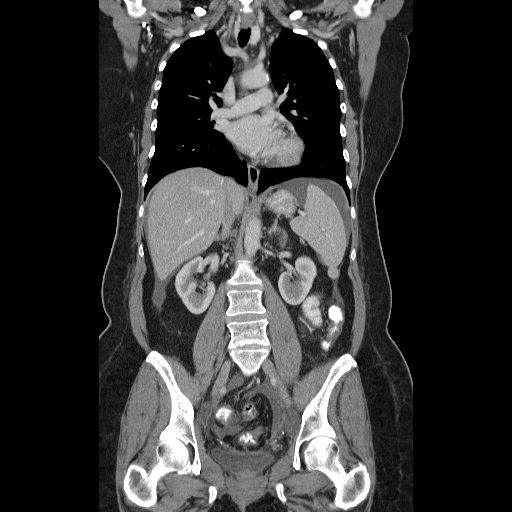

[16 of 46 positions shown; findings below may reference images not displayed]

FINDINGS: CT CHEST FINDINGS

Mediastinum/Nodes: No axillary or supraclavicular lymphadenopathy.
No mediastinal hilar lymphadenopathy. No pericardial fluid.
Esophagus normal.

Lungs/Pleura: No pulmonary nodularity. Airways are normal. Normal
pleural surface.

Musculoskeletal: No aggressive osseous lesion.

CT ABDOMEN AND PELVIS FINDINGS

Hepatobiliary: No focal hepatic lesion. Postcholecystectomy. Common
bile duct is mildly dilated but decreased in diameter compared to
prior.

Small amount free fluid surrounds the liver margin as well as the
splenic margin.

Pancreas: No pancreatic lesion.

Spleen: Normal spleen. Small amount fluid along the margin of the
spleen.

Adrenals/urinary tract: Adrenal glands and kidneys are normal. The
ureters and bladder normal.

Stomach/Bowel: Stomach, small bowel, appendix, and cecum are normal.
The colon and rectosigmoid colon are normal.

Vascular/Lymphatic: Abdominal aorta is normal caliber. There is no
retroperitoneal or periportal lymphadenopathy. No pelvic
lymphadenopathy.

Reproductive: Post hysterectomy. 2 small nodules in the region the
LEFT adnexa superior to the LEFT aspect of the vaginal cuff measure
4 mm and 7 mm on image 114, series 2. Similar nodules seen on
comparison CT 9424 suggests possible venous structures.

There is a low-density ovoid structure along the LEFT external iliac
artery measuring 2.5 x 2.0 cm which may represent postsurgical fluid
collection. The ovaries are not identified. No pelvic
lymphadenopathy.

Other: Moderate volume free fluid the pelvis and peritoneal space

Musculoskeletal: No aggressive osseous lesion.
IMPRESSION: Chest Impression:

No evidence of thoracic metastasis

Abdomen / Pelvis Impression:

1. No evidence of local metastasis in the pelvis.
2. Low-density ovoid structure along the LEFT obturator fossa may
represent a small postsurgical fluid collection.
3. Small to moderate volume intraperitoneal fluid in the abdomen
pelvis.
4. No lymphadenopathy.

## 2016-10-08 ENCOUNTER — Other Ambulatory Visit: Payer: Self-pay | Admitting: Family Medicine

## 2016-10-10 ENCOUNTER — Other Ambulatory Visit: Payer: 59

## 2016-10-10 ENCOUNTER — Encounter: Payer: 59 | Admitting: Genetic Counselor

## 2016-11-23 ENCOUNTER — Ambulatory Visit (INDEPENDENT_AMBULATORY_CARE_PROVIDER_SITE_OTHER): Payer: 59 | Admitting: Family Medicine

## 2016-11-23 ENCOUNTER — Encounter: Payer: Self-pay | Admitting: Family Medicine

## 2016-11-23 VITALS — BP 150/98 | HR 77 | Temp 98.4°F | Ht 66.0 in | Wt 203.0 lb

## 2016-11-23 DIAGNOSIS — G629 Polyneuropathy, unspecified: Secondary | ICD-10-CM

## 2016-11-23 DIAGNOSIS — I10 Essential (primary) hypertension: Secondary | ICD-10-CM | POA: Diagnosis not present

## 2016-11-23 HISTORY — DX: Polyneuropathy, unspecified: G62.9

## 2016-11-23 LAB — BASIC METABOLIC PANEL
BUN: 18 mg/dL (ref 6–23)
CALCIUM: 9.9 mg/dL (ref 8.4–10.5)
CO2: 28 mEq/L (ref 19–32)
Chloride: 102 mEq/L (ref 96–112)
Creatinine, Ser: 0.76 mg/dL (ref 0.40–1.20)
GFR: 82.02 mL/min (ref >60.00)
Glucose, Bld: 125 mg/dL — ABNORMAL HIGH (ref 70–99)
POTASSIUM: 3.8 meq/L (ref 3.5–5.1)
SODIUM: 138 meq/L (ref 135–145)

## 2016-11-23 LAB — VITAMIN B12: VITAMIN B 12: 173 pg/mL — AB (ref 211–911)

## 2016-11-23 LAB — TSH: TSH: 2.07 u[IU]/mL (ref 0.35–4.50)

## 2016-11-23 MED ORDER — HYDROCHLOROTHIAZIDE 12.5 MG PO TABS: 12.5000 mg | ORAL_TABLET | Freq: Every day | ORAL | 0 refills | Status: DC

## 2016-11-23 MED ORDER — GABAPENTIN 100 MG PO CAPS: 100.0000 mg | ORAL_CAPSULE | Freq: Every day | ORAL | 0 refills | Status: DC

## 2016-11-23 NOTE — Progress Notes (Signed)
   Subjective:    Patient ID: Amy Holmes, female    DOB: Nov 23, 1955, 61 y.o.   MRN: [id number]  HPI Here to follow up on HTN and to ask about neuropathy. She last saw her PCP, Dr. Maudie Mercury, almost 2 years ago. She was diagnosed with endometrial cancer and was followed closely by Oncology during the past year and a half. She has been taking HCTZ for her BP but she ran out last week. She had 3 rounds of chemotherapy and during the last round developed some neuropathy. This mostly involves the feet but also to a lesser extent the hands. She describes numbness, but not tingling or pain.    Review of Systems  Constitutional: Negative.   Respiratory: Negative.   Cardiovascular: Negative.   Neurological: Positive for numbness. Negative for weakness.       Objective:   Physical Exam  Constitutional: She is oriented to person, place, and time. She appears well-developed and well-nourished.  Cardiovascular: Normal rate, regular rhythm, normal heart sounds and intact distal pulses.   Pulmonary/Chest: Effort normal and breath sounds normal.  Musculoskeletal: She exhibits no edema.  Neurological: She is alert and oriented to person, place, and time.          Assessment & Plan:  For the HTN we refilled the HCTZ. The neuropathy is mostly likely from the chemotherapy but we will screen today with a BMET, TS, and B12 level. Try Gabapentin 100 mg qhs. Follow up with Dr. Maudie Mercury in 3-4 weeks.  Alysia Penna, MD

## 2016-11-23 NOTE — Patient Instructions (Signed)
WE NOW OFFER   [facility] FAST TRACK!!!  SAME DAY Appointments for ACUTE CARE  Such as: Sprains, Injuries, cuts, abrasions, rashes, muscle pain, joint pain, back pain Colds, flu, sore throats, headache, allergies, cough, fever  Ear pain, sinus and eye infections Abdominal pain, nausea, vomiting, diarrhea, upset stomach Animal/insect bites  3 Easy Ways to Schedule: Walk-In Scheduling Call in scheduling Mychart Sign-up: [email or web address]         

## 2016-11-23 NOTE — Progress Notes (Signed)
Pre visit review using our clinic review tool, if applicable. No additional management support is needed unless otherwise documented below in the visit note. 

## 2016-11-27 ENCOUNTER — Other Ambulatory Visit: Payer: Self-pay | Admitting: Family Medicine

## 2016-12-19 NOTE — Progress Notes (Signed)
HPI:  Amy Holmes is a pleasant 61 yo with a PMH anxiety, depression, HTN, Obesity, Palpitations, Tobacco use, endometrial ca whom I have not seen in several years is here for a refill on gabapentin. She was dx with endometrial ca in 2017 and is s/p hysterectomy and chemo. Developed some neuropathy with the chemo. Saw my colleague, Dr. Sarajane Jews, whom checked labs for other causes of of neuropathy and started gabapentin. B12 was low and oral B12 was initiated. She reports this came on intermittently with each chemo session and is improving since stopped chemo. Described as zinging feeling in both feet - was entire feet, now just ant feet. Started the B12 a few weeks ago. Gabapentin 100 helps some - does not want to increase. Denies symptoms elsewhere, fevers, malaise. No regular exercise. Diet not great. Denies CP, SOB, DOE, swelling, HA. Reports feels great. She has not been here for several years and is due for Hep C scree, Mammo? Colon ca screening? She is seeing oncologist. She does not want to do the preventive care today - agrees to set up pap with obgyn. Agrees to schedule CPE for follow up.  ROS: See pertinent positives and negatives per HPI.  Past Medical History:  Diagnosis Date  . #[id number] 12/2015   endometrial  . Anxiety and depression    situational  . Essential hypertension 06/09/2016  . History of radiation therapy 03/01/16-05/08/16   pelvis 45 Gy, HDR vaginal brachytherapy 18 Gy  . Insomnia   . Obesity   . Palpitations    2 months ago, stopped with energy drinks stopped  . Tobacco abuse     Past Surgical History:  Procedure Laterality Date  . CHOLECYSTECTOMY  2007  . LUMBAR DISC SURGERY     x 2  . LYMPH NODE DISSECTION N/A 01/19/2016   Procedure: sentienal LYMPH NODE mapping, bilateral pelvic lymph node dissection;  Surgeon: Everitt Amber, MD;  Location: [facility];  Service: Gynecology;  Laterality: N/A;  . ROBOTIC ASSISTED TOTAL HYSTERECTOMY WITH BILATERAL SALPINGO OOPHERECTOMY N/A  01/19/2016   Procedure: XI ROBOTIC ASSISTED TOTAL HYSTERECTOMY WITH BILATERAL SALPINGO OOPHORECTOMY;  Surgeon: Everitt Amber, MD;  Location: [facility];  Service: Gynecology;  Laterality: N/A;  . TUBAL LIGATION      Family History  Problem Relation Age of Onset  . Heart disease Mother   . Diabetes Mother   . Depression Maternal Grandmother   . Depression Maternal Grandfather   . Cancer Maternal Grandfather     unknown type  . Asthma Son   . Asthma Daughter     Social History   Social History  . Marital status: Divorced    Spouse name: N/A  . Number of children: 3  . Years of education: N/A   Occupational History  . HR     Social History Main Topics  . Smoking status: Current Every Day Smoker    Packs/day: 0.25    Years: 20.00    Types: Cigarettes    Last attempt to quit: 08/10/2014  . Smokeless tobacco: Never Used     Comment: 3 cig per day   . Alcohol use 0.0 oz/week     Comment: 1 drink here and there  . Drug use: No  . Sexual activity: Not Asked   Other Topics Concern  . None   Social History Narrative   Work or School:      Home Situation:      Spiritual Beliefs:      Lifestyle:  Current Outpatient Prescriptions:  .  aspirin EC 81 MG tablet, Take 81 mg by mouth daily., Disp: , Rfl:  .  CALCIUM CARBONATE PO, Take 1 tablet by mouth daily., Disp: , Rfl:  .  Coenzyme Q10 100 MG TABS, Take 100 mg by mouth daily., Disp: , Rfl:  .  gabapentin (NEURONTIN) 100 MG capsule, Take 1 capsule (100 mg total) by mouth at bedtime., Disp: 30 capsule, Rfl: 3 .  hydrochlorothiazide (HYDRODIURIL) 25 MG tablet, Take 1 tablet (25 mg total) by mouth daily., Disp: 90 tablet, Rfl: 3 .  vitamin C (ASCORBIC ACID) 500 MG tablet, Take 500 mg by mouth daily., Disp: , Rfl:   EXAM:  Vitals:   12/20/16 0938  BP: (!) 152/92  Pulse: 80  Temp: 98.7 F (37.1 C)    Body mass index is 32.22 kg/m.  GENERAL: vitals reviewed and listed above, alert, oriented, appears well  hydrated and in no acute distress  HEENT: atraumatic, conjunttiva clear, no obvious abnormalities on inspection of external nose and ears  NECK: no obvious masses on inspection  LUNGS: clear to auscultation bilaterally, no wheezes, rales or rhonchi, good air movement  CV: HRRR, no peripheral edema  MS: moves all extremities without noticeable abnormality  PSYCH: pleasant and cooperative, no obvious depression or anxiety  NEURO/FEET: normal inspection feet, normal strength/ROM/monofilament testinf bilat feet and ankles, cap refill normal, no lesions   ASSESSMENT AND PLAN:  Discussed the following assessment and plan:  Secondary hypertension  Neuropathy (HCC)  BMI 32.0-32.9,adult  -we discussed possible serious and likely etiologies, workup and treatment, treatment risks and return precautions for neuropathy - in her case chemo and low b12 likely playing a role and she feels is improving. Will cont b12 and gabapentin for now. Discussed risks gabapentin. -increase hctz -lifestyle recs -CPE, f/u BPand labs in 1-2 months  -of course, we advised [name]  to return or notify a doctor immediately if symptoms worsen or persist or new concerns arise.    Patient Instructions  BEFORE YOU LEAVE: -follow up: CPE and BP recheck in 1-2 months   Increase the blood pressur medication (hctz) to 25 mg daily in the morning- I sent in the 25mg  dose.  Continue the oral Vit B12 1075mcg daily.  Can use the gabapentin at night as needed.  Get regular exercise (at least 150 minutes) per week.  Eat a healthy diet. The Mediterranean diet is a good choice.   We recommend the following healthy lifestyle for LIFE: 1) Small portions.   Tip: eat off of a salad plate instead of a dinner plate.  Tip: It is ok to feel hungry after a meal of proper portion size.  Tip: if you need more or a snack choose fruits, veggies and/or a handful of nuts or seeds.  2) Eat a healthy clean diet.  * Tip: Avoid  (less then 1 serving per week): processed foods, sweets, sweetened drinks, white starches (rice, flour, bread, potatoes, pasta, etc), red meat, fast foods, butter  *Tip: CHOOSE instead   * 5-9 servings per day of fresh or frozen fruits and vegetables (but not corn, potatoes, bananas, canned or dried fruit)   *nuts and seeds, beans   *olives and olive oil   *small portions of lean meats such as fish and white chicken    *small portions of whole grains  3)Get at least 150 minutes of sweaty aerobic exercise per week.  4)Reduce stress - consider counseling, meditation and relaxation to balance other aspects  of your life.       Colin Benton R., DO

## 2016-12-20 ENCOUNTER — Ambulatory Visit (INDEPENDENT_AMBULATORY_CARE_PROVIDER_SITE_OTHER): Payer: 59 | Admitting: Family Medicine

## 2016-12-20 ENCOUNTER — Encounter: Payer: Self-pay | Admitting: Family Medicine

## 2016-12-20 VITALS — BP 152/92 | HR 80 | Temp 98.7°F | Ht 66.0 in | Wt 199.6 lb

## 2016-12-20 DIAGNOSIS — G629 Polyneuropathy, unspecified: Secondary | ICD-10-CM | POA: Diagnosis not present

## 2016-12-20 DIAGNOSIS — I159 Secondary hypertension, unspecified: Secondary | ICD-10-CM | POA: Diagnosis not present

## 2016-12-20 DIAGNOSIS — Z6832 Body mass index (BMI) 32.0-32.9, adult: Secondary | ICD-10-CM | POA: Diagnosis not present

## 2016-12-20 MED ORDER — HYDROCHLOROTHIAZIDE 25 MG PO TABS: 25.0000 mg | ORAL_TABLET | Freq: Every day | ORAL | 3 refills | Status: DC

## 2016-12-20 MED ORDER — GABAPENTIN 100 MG PO CAPS: 100.0000 mg | ORAL_CAPSULE | Freq: Every day | ORAL | 3 refills | Status: DC

## 2016-12-20 NOTE — Progress Notes (Signed)
Pre visit review using our clinic review tool, if applicable. No additional management support is needed unless otherwise documented below in the visit note. 

## 2016-12-20 NOTE — Patient Instructions (Signed)
BEFORE YOU LEAVE: -follow up: CPE and BP recheck in 1-2 months   Increase the blood pressur medication (hctz) to 25 mg daily in the morning- I sent in the 25mg  dose.  Continue the oral Vit B12 1059mcg daily.  Can use the gabapentin at night as needed.  Get regular exercise (at least 150 minutes) per week.  Eat a healthy diet. The Mediterranean diet is a good choice.   We recommend the following healthy lifestyle for LIFE: 1) Small portions.   Tip: eat off of a salad plate instead of a dinner plate.  Tip: It is ok to feel hungry after a meal of proper portion size.  Tip: if you need more or a snack choose fruits, veggies and/or a handful of nuts or seeds.  2) Eat a healthy clean diet.  * Tip: Avoid (less then 1 serving per week): processed foods, sweets, sweetened drinks, white starches (rice, flour, bread, potatoes, pasta, etc), red meat, fast foods, butter  *Tip: CHOOSE instead   * 5-9 servings per day of fresh or frozen fruits and vegetables (but not corn, potatoes, bananas, canned or dried fruit)   *nuts and seeds, beans   *olives and olive oil   *small portions of lean meats such as fish and white chicken    *small portions of whole grains  3)Get at least 150 minutes of sweaty aerobic exercise per week.  4)Reduce stress - consider counseling, meditation and relaxation to balance other aspects of your life.

## 2017-04-22 ENCOUNTER — Telehealth: Payer: Self-pay | Admitting: Family Medicine

## 2017-04-22 NOTE — Telephone Encounter (Signed)
Please let her know does not need referral for dermatology. Please provide her numbers to call. Thanks.

## 2017-04-22 NOTE — Telephone Encounter (Signed)
Pt called would like to have a referral to have a mole removed on her R check.

## 2017-04-23 NOTE — Telephone Encounter (Signed)
Amy Holmes pt returned your call °

## 2017-04-23 NOTE — Telephone Encounter (Signed)
I left a message for the pt to return my call. 

## 2017-04-23 NOTE — Telephone Encounter (Signed)
Amy Holmes pt returned your call and would like to have a call back at [phone]

## 2017-04-23 NOTE — Telephone Encounter (Signed)
I called the pt and informed her of the message below and she stated she has an appt tomorrow at the [facility].

## 2017-04-24 DIAGNOSIS — D485 Neoplasm of uncertain behavior of skin: Secondary | ICD-10-CM | POA: Diagnosis not present

## 2017-04-24 DIAGNOSIS — L821 Other seborrheic keratosis: Secondary | ICD-10-CM | POA: Diagnosis not present

## 2017-05-07 IMAGING — CT CT ABD-PELV W/ CM
2 of 5 series · 15 of 46 positions shown, 17 images · IV contrast (ISOVUE 300)
Comparison: February 02, 2016

CLINICAL DATA: History of endometrial carcinoma

EXAM:
CT ABDOMEN AND PELVIS WITH CONTRAST
TECHNIQUE: Multidetector CT imaging of the abdomen and pelvis was performed
using the standard protocol following bolus administration of
intravenous contrast. Oral contrast was also administered.
CONTRAST:  100mL 3R0Z7P-RXX IOPAMIDOL ([id number]-RXX) INJECTION 61%

[Series 2: axial st · axial · 0.73mm/px · z∈[-616,-171]mm · 12 of 99 slices shown, 14 images]
[im 5/99  soft-tissue]
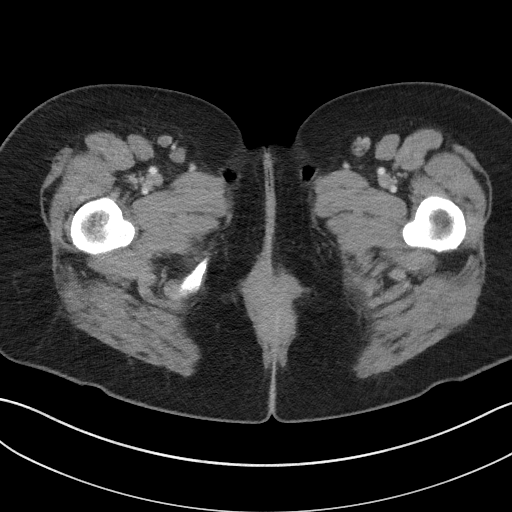
[im 5/99  bone]
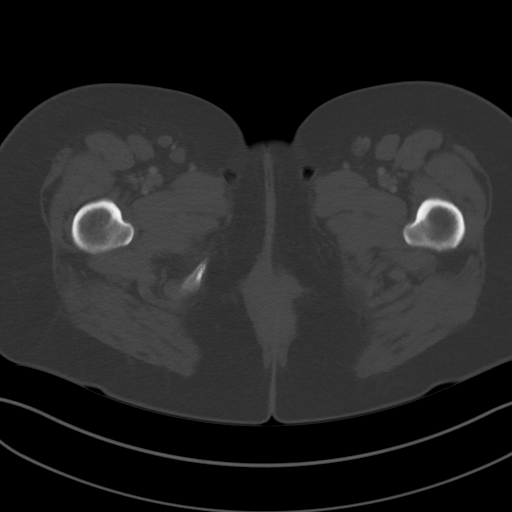
[im 15/99  soft-tissue]
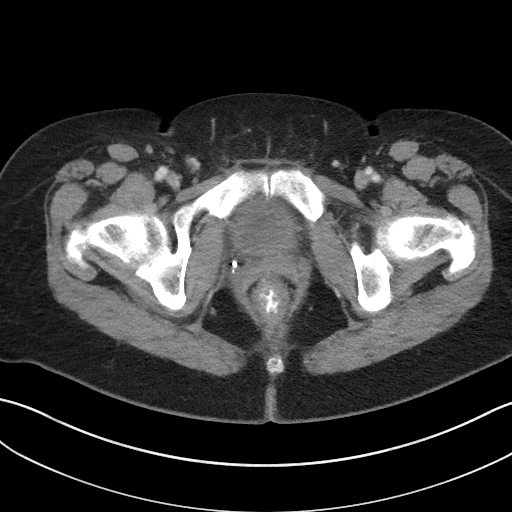
[im 20/99  soft-tissue]
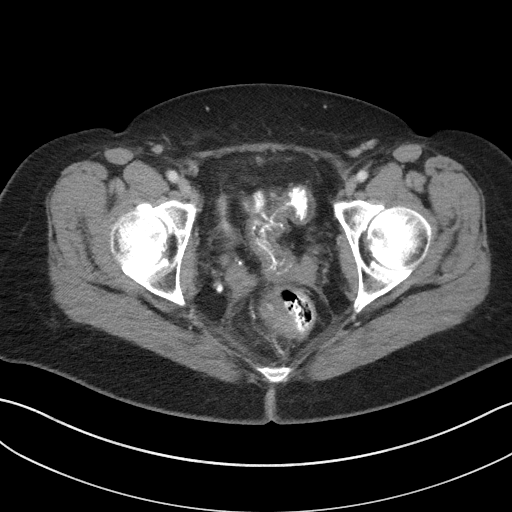
[im 30/99  soft-tissue]
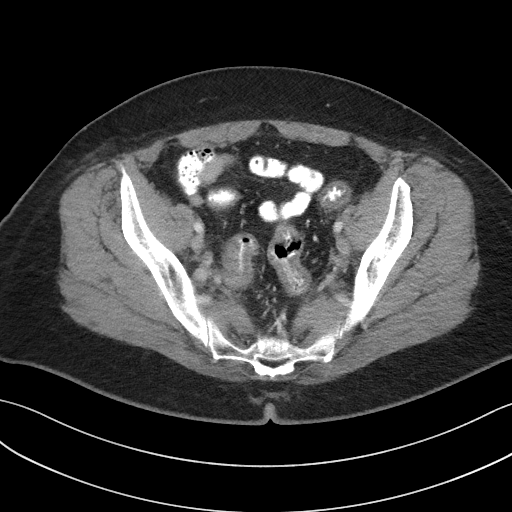
[im 40/99  soft-tissue]
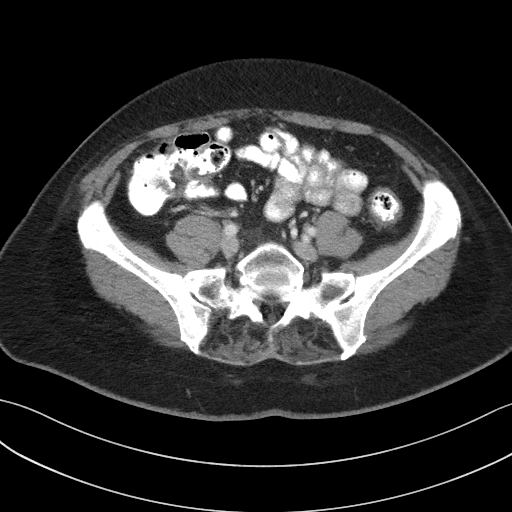
[im 45/99  soft-tissue]
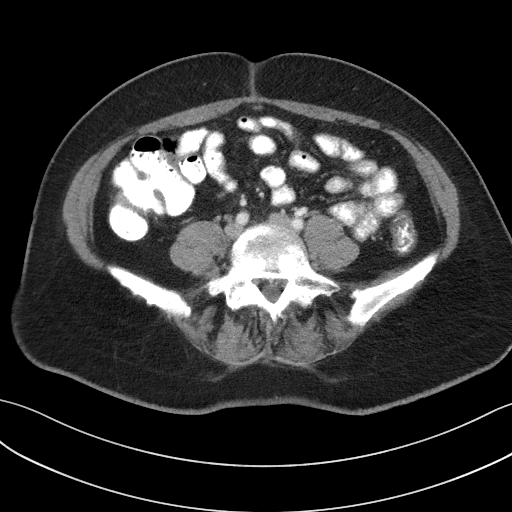
[im 54/99  soft-tissue]
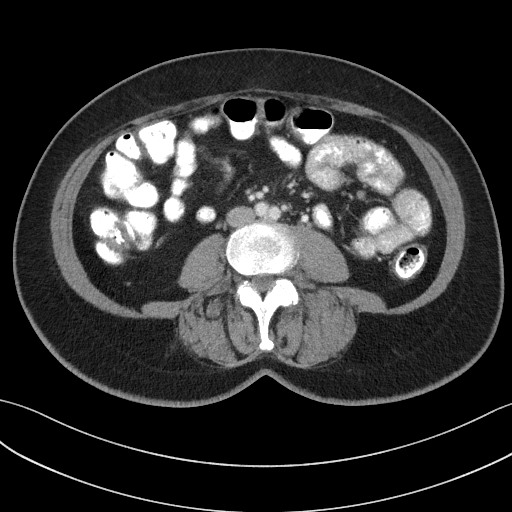
[im 59/99  soft-tissue]
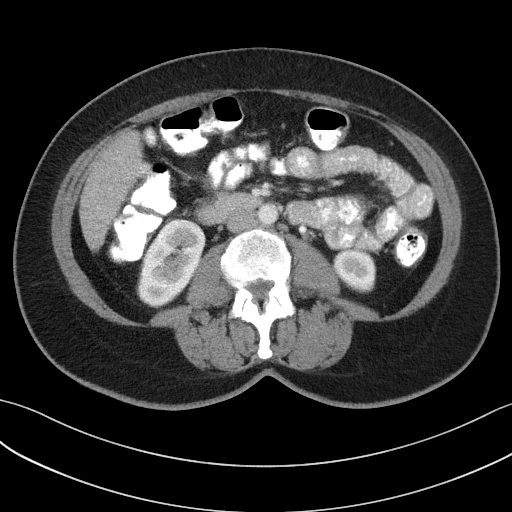
[im 69/99  soft-tissue]
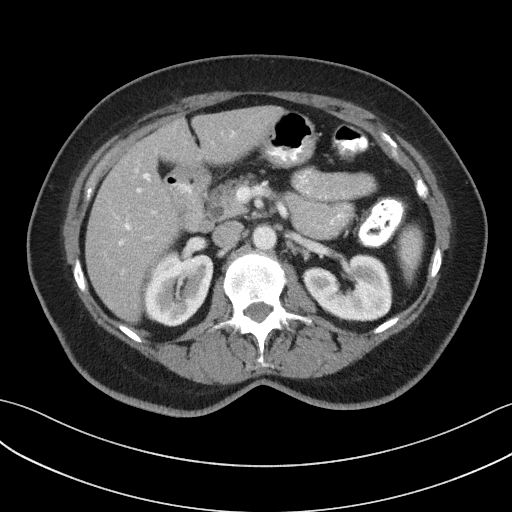
[im 69/99  bone]
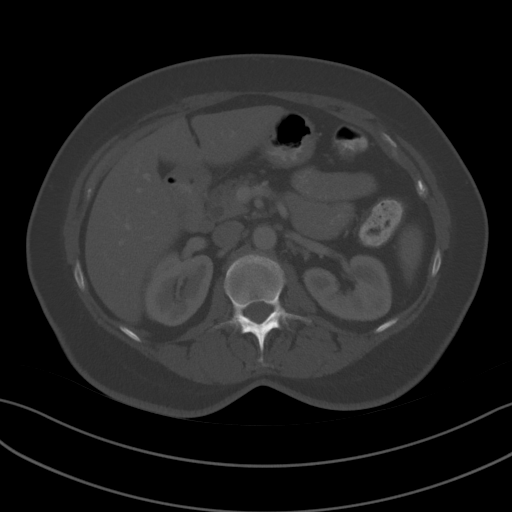
[im 79/99  soft-tissue]
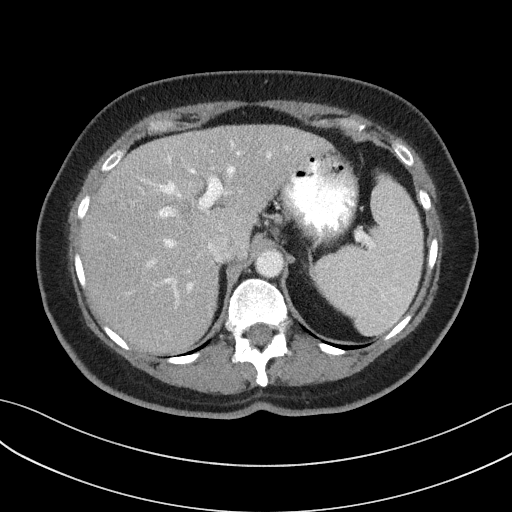
[im 84/99  soft-tissue]
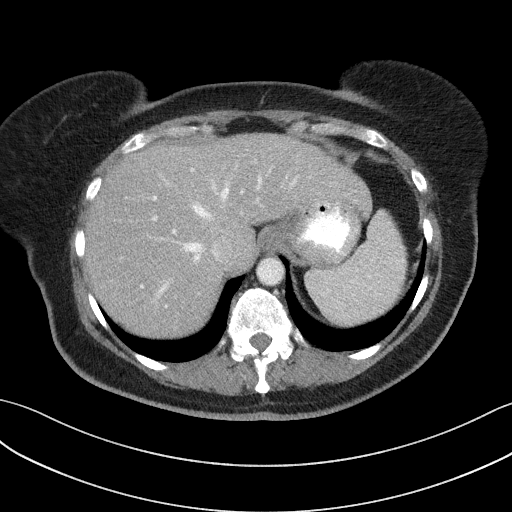
[im 94/99  soft-tissue]
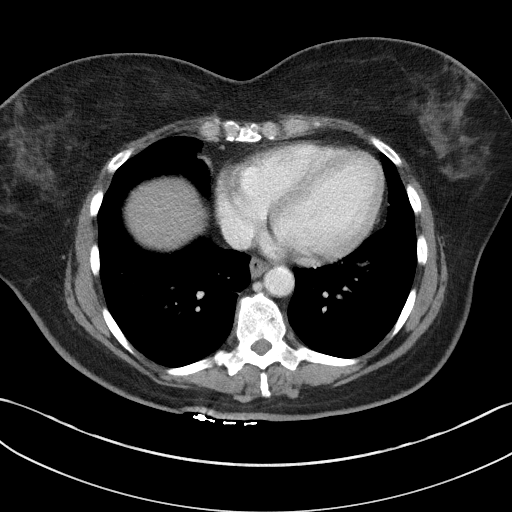

[Series 4: coronal st · coronal · 0.79mm/px · 3 of 100 slices shown]
[im 34/100  soft-tissue]
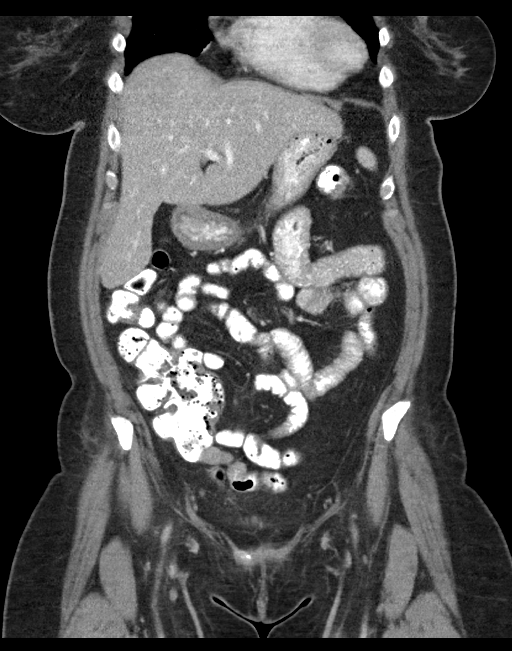
[im 45/100  soft-tissue]
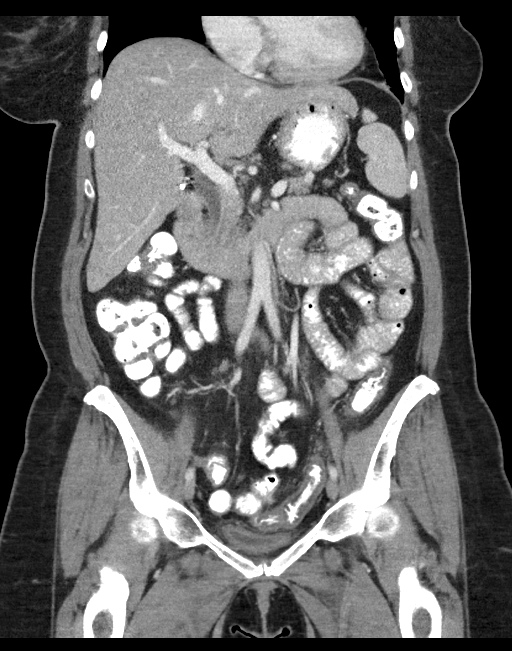
[im 56/100  soft-tissue]
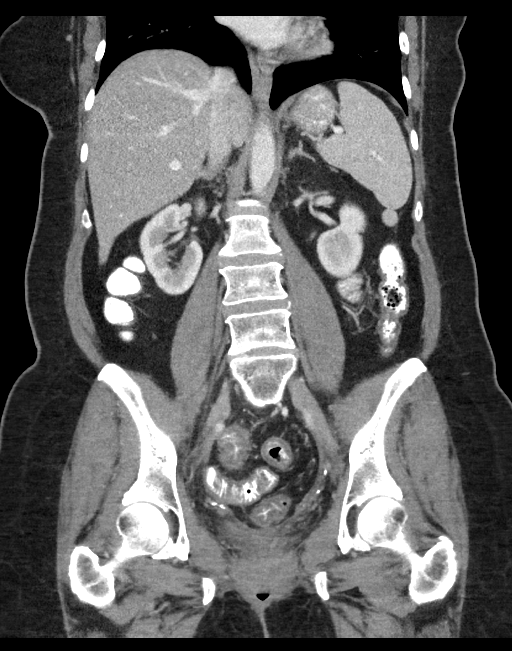

[15 of 46 positions shown; findings below may reference images not displayed]

FINDINGS: Lower chest: Lung bases are clear except for mild atelectasis in the
medial aspect of the right middle lobe.

Hepatobiliary: Liver measures 20.2 cm in length. There is hepatic
steatosis. No focal liver lesions are evident. Gallbladder is
absent. Common bile duct measures 9 mm which may be normal for post
cholecystectomy state. There is no biliary duct mass or calculus
evident.

Pancreas: No pancreatic mass or inflammatory focus.

Spleen: No splenic lesions are evident. There is a small splenule
inferior to the spleen.

Adrenals/Urinary Tract: Adrenals appear normal bilaterally. Kidneys
bilaterally show no evidence of mass or hydronephrosis. There is no
renal or ureteral calculus on either side. Urinary bladder is
midline. The wall of the urinary bladder appears mildly thickened in
a generalized manner.

Stomach/Bowel: There is wall thickening throughout the mid the
distal sigmoid colon. There is no surrounding mesenteric thickening.
No evidence of abscess or perforation in this area. There is no
bowel wall thickening elsewhere in the abdomen or pelvis. There is
no bowel obstruction. No free air or portal venous air.

Vascular/Lymphatic: There is no abdominal aortic aneurysm. Major
mesenteric vessels appear patent.

There is no evident adenopathy in abdomen or pelvis.

Reproductive: Uterus is absent. There is no evident pelvic mass or
pelvic fluid collection.

Other: Appendix appears unremarkable. There is no abscess or ascites
in the abdomen or pelvis. There is no abnormal fluid collection in
the abdomen or pelvis. No omental lesions are evident in the abdomen
or pelvis.

Musculoskeletal: There is degenerative change in the lumbar spine.
There are no blastic or lytic bone lesions. There is no abdominal
wall or intramuscular lesion.
IMPRESSION: Uterus and gallbladder absent. No pelvic mass or abnormal pelvic
fluid collection. No adenopathy. No omental lesions.

Urinary bladder wall is mildly thickened. There is also thickening
of loops of mid to distal sigmoid colon. These changes all may be
indicative of previous radiation therapy to the pelvis. With respect
to the urinary bladder, a degree of acute cystitis may well be
present; correlation with urinalysis advised in this regard. With
respect to changes in the distal colon, nonemergent sigmoidoscopy
may well be advisable to further evaluate. A degree of colitis
cannot be excluded. There is no apparent mass within the distal
colon or urinary bladder demonstrated by CT.

No hydronephrosis.  No renal or ureteral calculi.

Liver is prominent with hepatic steatosis. No focal liver lesions
are evident.

## 2017-05-30 ENCOUNTER — Encounter: Payer: Self-pay | Admitting: Family Medicine

## 2017-07-16 DIAGNOSIS — L821 Other seborrheic keratosis: Secondary | ICD-10-CM | POA: Diagnosis not present

## 2017-09-19 ENCOUNTER — Encounter: Payer: Self-pay | Admitting: Family Medicine

## 2018-01-03 ENCOUNTER — Other Ambulatory Visit: Payer: Self-pay | Admitting: Family Medicine

## 2018-01-30 ENCOUNTER — Ambulatory Visit: Payer: 59 | Admitting: Family Medicine

## 2018-01-30 ENCOUNTER — Encounter: Payer: Self-pay | Admitting: Family Medicine

## 2018-01-30 VITALS — BP 136/88 | HR 81 | Temp 98.4°F | Ht 66.0 in | Wt 208.1 lb

## 2018-01-30 DIAGNOSIS — Z6833 Body mass index (BMI) 33.0-33.9, adult: Secondary | ICD-10-CM | POA: Diagnosis not present

## 2018-01-30 DIAGNOSIS — I10 Essential (primary) hypertension: Secondary | ICD-10-CM | POA: Diagnosis not present

## 2018-01-30 DIAGNOSIS — R202 Paresthesia of skin: Secondary | ICD-10-CM

## 2018-01-30 DIAGNOSIS — E538 Deficiency of other specified B group vitamins: Secondary | ICD-10-CM

## 2018-01-30 NOTE — Progress Notes (Signed)
HPI:  Using dictation device. Unfortunately this device frequently misinterprets words/phrases.  Here for follow up. PMH B12 def, paresthesias, HTN, obesity, anxiety, depression, palpitations and endometrial cancer whom rarely come in, here for refills/follow up.  Diet and exercise not a good recently. Needs refills and labs. Due for several preventive measures. No CP, SOB, DOE, HA.    ROS: See pertinent positives and negatives per HPI.  Past Medical History:  Diagnosis Date  . #[id number] 12/2015   endometrial  . Anxiety and depression    situational  . Essential hypertension 06/09/2016  . History of radiation therapy 03/01/16-05/08/16   pelvis 45 Gy, HDR vaginal brachytherapy 18 Gy  . Insomnia   . Obesity   . Palpitations    2 months ago, stopped with energy drinks stopped  . Tobacco abuse     Past Surgical History:  Procedure Laterality Date  . CHOLECYSTECTOMY  2007  . LUMBAR DISC SURGERY     x 2  . LYMPH NODE DISSECTION N/A 01/19/2016   Procedure: sentienal LYMPH NODE mapping, bilateral pelvic lymph node dissection;  Surgeon: Everitt Amber, MD;  Location: [facility];  Service: Gynecology;  Laterality: N/A;  . ROBOTIC ASSISTED TOTAL HYSTERECTOMY WITH BILATERAL SALPINGO OOPHERECTOMY N/A 01/19/2016   Procedure: XI ROBOTIC ASSISTED TOTAL HYSTERECTOMY WITH BILATERAL SALPINGO OOPHORECTOMY;  Surgeon: Everitt Amber, MD;  Location: [facility];  Service: Gynecology;  Laterality: N/A;  . TUBAL LIGATION      Family History  Problem Relation Age of Onset  . Heart disease Mother   . Diabetes Mother   . Depression Maternal Grandmother   . Depression Maternal Grandfather   . Cancer Maternal Grandfather        unknown type  . Asthma Son   . Asthma Daughter     SOCIAL HX: see hpi   Current Outpatient Medications:  .  aspirin EC 81 MG tablet, Take 81 mg by mouth daily., Disp: , Rfl:  .  Coenzyme Q10 100 MG TABS, Take 100 mg by mouth daily., Disp: , Rfl:  .  gabapentin (NEURONTIN) 100 MG capsule,  Take 1 capsule (100 mg total) by mouth at bedtime., Disp: 30 capsule, Rfl: 3 .  hydrochlorothiazide (HYDRODIURIL) 25 MG tablet, TAKE 1 TABLET BY MOUTH EVERY DAY, Disp: 30 tablet, Rfl: 5  EXAM:  Vitals:   01/30/18 1634  BP: 136/88  Pulse: 81  Temp: 98.4 F (36.9 C)    Body mass index is 33.59 kg/m.  GENERAL: vitals reviewed and listed above, alert, oriented, appears well hydrated and in no acute distress  HEENT: atraumatic, conjunttiva clear, no obvious abnormalities on inspection of external nose and ears  NECK: no obvious masses on inspection  LUNGS: clear to auscultation bilaterally, no wheezes, rales or rhonchi, good air movement  CV: HRRR, no peripheral edema  MS: moves all extremities without noticeable abnormality  PSYCH: pleasant and cooperative, no obvious depression or anxiety  ASSESSMENT AND PLAN:  Discussed the following assessment and plan:  Essential hypertension - Plan: Basic metabolic panel, CBC  BMI 61.4-43.1,VQMGQ - Plan: Hemoglobin A1c, Lipid panel  Paresthesia  B12 deficiency - Plan: Vitamin B12  -labs per orders -refill BP med -lifestyle recs discussed and extensive summary in pt instructions - 10lb wt reduction over next 3 months advised -BP borderline, CPE/recheck in 2-3 months -advised to do mammogram and colon cancer screening - she prefers cologuard, advised assistant to order/refer -Patient advised to return or notify a doctor immediately if symptoms worsen or persist or new  concerns arise.  Patient Instructions  BEFORE YOU LEAVE: -referral for mammogram  -order cologuard -labs or fasting lab appt in next 1 week -follow up: 2-3 months for CPE  Advise a 10lb weight reduction over next 3 months.  We recommend the following healthy lifestyle for LIFE: 1) Small portions. But, make sure to get regular (at least 3 per day), healthy meals and small healthy snacks if needed.  2) Eat a healthy clean diet.   TRY TO EAT: -at least 5-7  servings of low sugar, colorful, and nutrient rich vegetables per day (not corn, potatoes or bananas.) -berries are the best choice if you wish to eat fruit (only eat small amounts if trying to reduce weight)  -lean meets (fish, white meat of chicken or [place]) -vegan proteins for some meals - beans or tofu, whole grains, nuts and seeds -Replace bad fats with good fats - good fats include: fish, nuts and seeds, canola oil, olive oil -small amounts of low fat or non fat dairy -small amounts of100 % whole grains - check the lables -drink plenty of water  AVOID: -SUGAR, sweets, anything with added sugar, corn syrup or sweeteners - must read labels as even foods advertised as "healthy" often are loaded with sugar -if you must have a sweetener, small amounts of stevia may be best -sweetened beverages and artificially sweetened beverages -simple starches (rice, bread, potatoes, pasta, chips, etc - small amounts of 100% whole grains are ok) -red meat, pork, butter -fried foods, fast food, processed food, excessive dairy, eggs and coconut.  3)Get at least 150 minutes of sweaty aerobic exercise per week.  4)Reduce stress - consider counseling, meditation and relaxation to balance other aspects of your life.     Amy Kern, DO

## 2018-01-30 NOTE — Patient Instructions (Addendum)
BEFORE YOU LEAVE: -referral for mammogram  -order cologuard -labs or fasting lab appt in next 1 week -follow up: 2-3 months for CPE  Advise a 10lb weight reduction over next 3 months.  We recommend the following healthy lifestyle for LIFE: 1) Small portions. But, make sure to get regular (at least 3 per day), healthy meals and small healthy snacks if needed.  2) Eat a healthy clean diet.   TRY TO EAT: -at least 5-7 servings of low sugar, colorful, and nutrient rich vegetables per day (not corn, potatoes or bananas.) -berries are the best choice if you wish to eat fruit (only eat small amounts if trying to reduce weight)  -lean meets (fish, white meat of chicken or [place]) -vegan proteins for some meals - beans or tofu, whole grains, nuts and seeds -Replace bad fats with good fats - good fats include: fish, nuts and seeds, canola oil, olive oil -small amounts of low fat or non fat dairy -small amounts of100 % whole grains - check the lables -drink plenty of water  AVOID: -SUGAR, sweets, anything with added sugar, corn syrup or sweeteners - must read labels as even foods advertised as "healthy" often are loaded with sugar -if you must have a sweetener, small amounts of stevia may be best -sweetened beverages and artificially sweetened beverages -simple starches (rice, bread, potatoes, pasta, chips, etc - small amounts of 100% whole grains are ok) -red meat, pork, butter -fried foods, fast food, processed food, excessive dairy, eggs and coconut.  3)Get at least 150 minutes of sweaty aerobic exercise per week.  4)Reduce stress - consider counseling, meditation and relaxation to balance other aspects of your life.

## 2018-01-31 ENCOUNTER — Other Ambulatory Visit: Payer: Self-pay | Admitting: Family Medicine

## 2018-02-05 ENCOUNTER — Encounter: Payer: Self-pay | Admitting: Family Medicine

## 2018-07-26 ENCOUNTER — Other Ambulatory Visit: Payer: Self-pay | Admitting: Family Medicine

## 2018-07-29 ENCOUNTER — Other Ambulatory Visit: Payer: Self-pay | Admitting: Family Medicine

## 2018-07-29 NOTE — Telephone Encounter (Signed)
Copied from [place] [id number]. Topic: Quick Communication - Rx Refill/Question >> Jul 29, 2018  8:49 AM Amy Holmes, NT wrote: Medication: hydrochlorothiazide (HYDRODIURIL) 25 MG tablet  Has the patient contacted their pharmacy? yes  (Agent: If no, request that the patient contact the pharmacy for the refill (Agent: If yes, when and what did the pharmacy advise?  Preferred Pharmacy (with phone number or street name [id number] - [place], [place] - [address] [phone] (Phone) [phone] (Fax)    Agent: Please be advised that RX refills may take up to 3 business days. We ask that you follow-up with your pharmacy.

## 2018-08-01 NOTE — Progress Notes (Signed)
HPI:  Using dictation device. Unfortunately this device frequently misinterprets words/phrases.  Amy Holmes is a pleasant 62 y.o. here for follow up. Chronic medical problems summarized below were reviewed for changes and stability and were updated as needed below. These issues and their treatment remain stable for the most part.  Reports is doing well.  No complaints today.  She is due for follow-up with her oncologist and agrees to call.  She also looks like she is due for colonoscopy, she thinks she may have had one in the last several years.  She is due for mammogram and she agrees to call to schedule this.  She is due for lab work, agrees to do today.  Declines flu shot today.  Reports she is getting regular exercise and is eating healthy.  Continues to smoke 3 cigarettes/day and is not interested in quitting at this time.  Denies CP, SOB, DOE, treatment intolerance or new symptoms.   B12 def/hx paresthesias: -oral supplementation advised  HTN/Obesity: -meds: hydrochlorthiazide  Hx Neuropathy: -meds: gabapentin in the past -resolved after starting b12 and no longer take gabapentin  Hx of endometrial cancer: -followed by gyn onc  ROS: See pertinent positives and negatives per HPI.  Past Medical History:  Diagnosis Date  . #[id number] 12/2015   endometrial  . Anxiety and depression    situational  . Cough 11/09/2014   Followed in Pulmonary clinic/  Healthcare/ [place]    . Essential hypertension 06/09/2016  . History of radiation therapy 03/01/16-05/08/16   pelvis 45 Gy, HDR vaginal brachytherapy 18 Gy  . Insomnia   . [employer] Federation of Gynecology and Obstetrics (FIGO) stage IIIC1 malignant neoplasm of endometrium ([place]) 02/08/2016  . Neuropathy 11/23/2016  . Obesity   . Palpitations    2 months ago, stopped with energy drinks stopped  . Status post cholecystectomy 02/08/2016  . Tobacco abuse     Past Surgical History:  Procedure Laterality Date  .  CHOLECYSTECTOMY  2007  . LUMBAR DISC SURGERY     x 2  . LYMPH NODE DISSECTION N/A 01/19/2016   Procedure: sentienal LYMPH NODE mapping, bilateral pelvic lymph node dissection;  Surgeon: Everitt Amber, MD;  Location: [facility];  Service: Gynecology;  Laterality: N/A;  . ROBOTIC ASSISTED TOTAL HYSTERECTOMY WITH BILATERAL SALPINGO OOPHERECTOMY N/A 01/19/2016   Procedure: XI ROBOTIC ASSISTED TOTAL HYSTERECTOMY WITH BILATERAL SALPINGO OOPHORECTOMY;  Surgeon: Everitt Amber, MD;  Location: [facility];  Service: Gynecology;  Laterality: N/A;  . TUBAL LIGATION      Family History  Problem Relation Age of Onset  . Heart disease Mother   . Diabetes Mother   . Depression Maternal Grandmother   . Depression Maternal Grandfather   . Cancer Maternal Grandfather        unknown type  . Asthma Son   . Asthma Daughter     SOCIAL HX: See HPI, continues to smoke 3 cigarettes/day, not interested in quitting   Current Outpatient Medications:  .  aspirin EC 81 MG tablet, Take 81 mg by mouth daily., Disp: , Rfl:  .  Coenzyme Q10 100 MG TABS, Take 100 mg by mouth daily., Disp: , Rfl:  .  hydrochlorothiazide (HYDRODIURIL) 25 MG tablet, Take 1 tablet (25 mg total) by mouth daily., Disp: 90 tablet, Rfl: 1  EXAM:  Vitals:   08/04/18 1054  BP: 128/70  Pulse: 80  Temp: 98.5 F (36.9 C)    Body mass index is 33.51 kg/m.  GENERAL: vitals reviewed and listed above,  alert, oriented, appears well hydrated and in no acute distress  HEENT: atraumatic, conjunttiva clear, no obvious abnormalities on inspection of external nose and ears  NECK: no obvious masses on inspection  LUNGS: clear to auscultation bilaterally, no wheezes, rales or rhonchi, good air movement  CV: HRRR, no peripheral edema  MS: moves all extremities without noticeable abnormality  PSYCH: pleasant and cooperative, no obvious depression or anxiety  ASSESSMENT AND PLAN:  Discussed the following assessment and plan:  Essential hypertension -  Plan: Basic metabolic panel, CBC  Smoker  B12 deficiency - Plan: Vitamin B12  Obesity (BMI 30-39.9) - Plan: HDL cholesterol, Cholesterol, total, Hemoglobin A1c  History of endometrial cancer  Need for hepatitis C screening test - Plan: Hepatitis C antibody  Colon cancer screening - Plan: Ambulatory referral to Gastroenterology  -Labs per orders -Recommended a healthy low sugar diet, regular exercise and smoking cessation -Advise she contact her oncologist today to schedule follow-up -Advised of health maintenance measures due, advised to follow-up with gastroenterology regarding colon cancer screening, I do not see where this was done, advised assistant to place referral -Advised mammogram, she agrees to call to schedule -Advised follow-up in 3 months -Continue blood pressure medication and B12 -Follow-up sooner as needed  Patient Instructions  BEFORE YOU LEAVE: -number for her gyn oncology office -labs -refer GI for colon cancer screening -follow up: 3 months  You are past due for follow up with your Cancer Specialist. We advise that you call today to schedule your follow up.  It appears your are past due for colon cancer screening. Recommend that you see the gastroenterologist.  We have ordered labs or studies at this visit. It can take up to 1-2 weeks for results and processing. IF results require follow up or explanation, we will call you with instructions. Clinically stable results will be released to your [facility]. If you have not heard from [place] or cannot find your results in [facility] in 2 weeks please contact our office at [phone].  If you are not yet signed up for [facility], please consider signing up.    We recommend the following healthy lifestyle for LIFE: 1) Small portions. But, make sure to get regular (at least 3 per day), healthy meals and small healthy snacks if needed.  2) Eat a healthy clean diet. And, please quit smoking.   TRY TO EAT: -at least 5-7  servings of low sugar, colorful, and nutrient rich vegetables per day (not corn, potatoes or bananas.) -berries are the best choice if you wish to eat fruit (only eat small amounts if trying to reduce weight)  -lean meets (fish, white meat of chicken or [place]) -vegan proteins for some meals - beans or tofu, whole grains, nuts and seeds -Replace bad fats with good fats - good fats include: fish, nuts and seeds, canola oil, olive oil -small amounts of low fat or non fat dairy -small amounts of100 % whole grains - check the lables -drink plenty of water  AVOID: -SUGAR, sweets, anything with added sugar, corn syrup or sweeteners - must read labels as even foods advertised as "healthy" often are loaded with sugar -if you must have a sweetener, small amounts of stevia may be best -sweetened beverages and artificially sweetened beverages -simple starches (rice, bread, potatoes, pasta, chips, etc - small amounts of 100% whole grains are ok) -red meat, pork, butter -fried foods, fast food, processed food, excessive dairy, eggs and coconut.  3)Get at least 150 minutes of sweaty aerobic exercise  per week.  4)Reduce stress - consider counseling, meditation and relaxation to balance other aspects of your life.           Lucretia Kern, DO

## 2018-08-04 ENCOUNTER — Encounter: Payer: Self-pay | Admitting: Family Medicine

## 2018-08-04 ENCOUNTER — Ambulatory Visit: Payer: 59 | Admitting: Family Medicine

## 2018-08-04 VITALS — BP 128/70 | HR 80 | Temp 98.5°F | Ht 66.0 in | Wt 207.6 lb

## 2018-08-04 DIAGNOSIS — I10 Essential (primary) hypertension: Secondary | ICD-10-CM | POA: Diagnosis not present

## 2018-08-04 DIAGNOSIS — E669 Obesity, unspecified: Secondary | ICD-10-CM | POA: Diagnosis not present

## 2018-08-04 DIAGNOSIS — Z8542 Personal history of malignant neoplasm of other parts of uterus: Secondary | ICD-10-CM

## 2018-08-04 DIAGNOSIS — E538 Deficiency of other specified B group vitamins: Secondary | ICD-10-CM | POA: Diagnosis not present

## 2018-08-04 DIAGNOSIS — Z1159 Encounter for screening for other viral diseases: Secondary | ICD-10-CM

## 2018-08-04 DIAGNOSIS — F17200 Nicotine dependence, unspecified, uncomplicated: Secondary | ICD-10-CM | POA: Diagnosis not present

## 2018-08-04 DIAGNOSIS — Z1211 Encounter for screening for malignant neoplasm of colon: Secondary | ICD-10-CM

## 2018-08-04 MED ORDER — HYDROCHLOROTHIAZIDE 25 MG PO TABS: 25.0000 mg | ORAL_TABLET | Freq: Every day | ORAL | 1 refills | Status: DC

## 2018-08-04 NOTE — Patient Instructions (Addendum)
BEFORE YOU LEAVE: -number for her gyn oncology office -labs -refer GI for colon cancer screening -follow up: 3 months  You are past due for follow up with your Cancer Specialist. We advise that you call today to schedule your follow up.  It appears your are past due for colon cancer screening. Recommend that you see the gastroenterologist.  We have ordered labs or studies at this visit. It can take up to 1-2 weeks for results and processing. IF results require follow up or explanation, we will call you with instructions. Clinically stable results will be released to your [facility]. If you have not heard from [place] or cannot find your results in [facility] in 2 weeks please contact our office at [phone].  If you are not yet signed up for [facility], please consider signing up.    We recommend the following healthy lifestyle for LIFE: 1) Small portions. But, make sure to get regular (at least 3 per day), healthy meals and small healthy snacks if needed.  2) Eat a healthy clean diet. And, please quit smoking.   TRY TO EAT: -at least 5-7 servings of low sugar, colorful, and nutrient rich vegetables per day (not corn, potatoes or bananas.) -berries are the best choice if you wish to eat fruit (only eat small amounts if trying to reduce weight)  -lean meets (fish, white meat of chicken or [place]) -vegan proteins for some meals - beans or tofu, whole grains, nuts and seeds -Replace bad fats with good fats - good fats include: fish, nuts and seeds, canola oil, olive oil -small amounts of low fat or non fat dairy -small amounts of100 % whole grains - check the lables -drink plenty of water  AVOID: -SUGAR, sweets, anything with added sugar, corn syrup or sweeteners - must read labels as even foods advertised as "healthy" often are loaded with sugar -if you must have a sweetener, small amounts of stevia may be best -sweetened beverages and artificially sweetened beverages -simple starches (rice,  bread, potatoes, pasta, chips, etc - small amounts of 100% whole grains are ok) -red meat, pork, butter -fried foods, fast food, processed food, excessive dairy, eggs and coconut.  3)Get at least 150 minutes of sweaty aerobic exercise per week.  4)Reduce stress - consider counseling, meditation and relaxation to balance other aspects of your life.

## 2018-10-20 ENCOUNTER — Encounter: Payer: Self-pay | Admitting: Family Medicine

## 2018-10-25 ENCOUNTER — Other Ambulatory Visit: Payer: Self-pay | Admitting: Nurse Practitioner

## 2019-02-26 ENCOUNTER — Other Ambulatory Visit: Payer: Self-pay | Admitting: Family Medicine

## 2019-07-13 ENCOUNTER — Other Ambulatory Visit: Payer: Self-pay | Admitting: Family Medicine

## 2019-10-26 ENCOUNTER — Other Ambulatory Visit: Payer: Self-pay | Admitting: Family Medicine

## 2019-10-26 ENCOUNTER — Telehealth: Payer: Self-pay | Admitting: Family Medicine

## 2019-10-26 NOTE — Telephone Encounter (Signed)
Pt called to refill her BP medication. Informed pt that she will need an appt in order to get a refill according to the Meds chart.  Scheduled pt appt for 10/27/19 with Maudie Mercury

## 2019-10-27 ENCOUNTER — Other Ambulatory Visit: Payer: Self-pay

## 2019-10-27 ENCOUNTER — Telehealth (INDEPENDENT_AMBULATORY_CARE_PROVIDER_SITE_OTHER): Payer: 59 | Admitting: Family Medicine

## 2019-10-27 DIAGNOSIS — I10 Essential (primary) hypertension: Secondary | ICD-10-CM

## 2019-10-27 DIAGNOSIS — E669 Obesity, unspecified: Secondary | ICD-10-CM

## 2019-10-27 DIAGNOSIS — Z7189 Other specified counseling: Secondary | ICD-10-CM

## 2019-10-27 NOTE — Progress Notes (Signed)
Virtual Visit via Video Note  I connected with Amy Holmes  on 10/27/19 at 10:40 AM EST by a video enabled telemedicine application and verified that I am speaking with the correct person using two identifiers.  Location patient: home Location provider:work or home office Persons participating in the virtual visit: patient, provider  I discussed the limitations of evaluation and management by telemedicine and the availability of in person appointments. The patient expressed understanding and agreed to proceed.   HPI:  Acute visit for refill request. Is moving to [place]. Plans to find a PCP there then. Had her flu and Tdap shots. Afraid to get mammo and colon cancer screening during the pandemic, but agrees to address with new PCP. Has new grandchild. They have been helping with the child. Reports has been doing well despite the pandemic. They are very cautious and only go grocery shopping but only with masks and distancing. She can not wait to get the COVID19 shot. Reports monitors her BP and has been ok. Recently normal when checked a few weeks ago. Reports is power walking 3 days per week. Trying to stay healthy. They walk several times per day with the dogs too. Denies CP, SOB, DOE, swelling.   ROS: See pertinent positives and negatives per HPI.  Past Medical History:  Diagnosis Date  . [id number] 12/2015   endometrial  . Anxiety and depression    situational  . Cough 11/09/2014   Followed in Pulmonary clinic/ [facility]/ [place]    . Essential hypertension 06/09/2016  . History of radiation therapy 03/01/16-05/08/16   pelvis 45 Gy, HDR vaginal brachytherapy 18 Gy  . Insomnia   . [employer] Gyne[employer] Obstetric[employer] (FIGO) stage IIIC1 malignant neoplasm of endometrium ([place]) 02/08/2016  . Neuropathy 11/23/2016  . Obesity   . Palpitations    2 months ago, stopped with energy drinks stopped  . Status post cholecystectomy 02/08/2016  . Tobacco abuse     Past Surgical  History:  Procedure Laterality Date  . CHOLECYSTECTOMY  2007  . LUMBAR DISC SURGERY     x 2  . LYMPH NODE DISSECTION N/A 01/19/2016   Procedure: sentienal LYMPH NODE mapping, bilateral pelvic lymph node dissection;  Surgeon: Everitt Amber, MD;  Location: [facility];  Service: Gynecology;  Laterality: N/A;  . ROBOTIC ASSISTED TOTAL HYSTERECTOMY WITH BILATERAL SALPINGO OOPHERECTOMY N/A 01/19/2016   Procedure: XI ROBOTIC ASSISTED TOTAL HYSTERECTOMY WITH BILATERAL SALPINGO OOPHORECTOMY;  Surgeon: Everitt Amber, MD;  Location: [facility];  Service: Gynecology;  Laterality: N/A;  . TUBAL LIGATION      Family History  Problem Relation Age of Onset  . Heart disease Mother   . Diabetes Mother   . Depression Maternal Grandmother   . Depression Maternal Grandfather   . Cancer Maternal Grandfather        unknown type  . Asthma Son   . Asthma Daughter     SOCIAL HX:see hpi   Current Outpatient Medications:  .  aspirin EC 81 MG tablet, Take 81 mg by mouth daily., Disp: , Rfl:  .  Coenzyme Q10 100 MG TABS, Take 100 mg by mouth daily., Disp: , Rfl:  .  hydrochlorothiazide (HYDRODIURIL) 25 MG tablet, TAKE 1 TABLET(25 MG) BY MOUTH DAILY, Disp: 90 tablet, Rfl: 0  EXAM:  VITALS per patient if applicable:see hpi  GENERAL: alert, oriented, appears well and in no acute distress  HEENT: atraumatic, conjunttiva clear, no obvious abnormalities on inspection of external nose and ears  NECK: normal movements  of the head and neck  LUNGS: on inspection no signs of respiratory distress, breathing rate appears normal, no obvious gross SOB, gasping or wheezing  CV: no obvious cyanosis  MS: moves all visible extremities without noticeable abnormality  PSYCH/NEURO: pleasant and cooperative, no obvious depression or anxiety, speech and thought processing grossly intact  ASSESSMENT AND PLAN:  Discussed the following assessment and plan:  Obesity (BMI 30-39.9)  Essential hypertension  Counseling on health  promotion and disease prevention  -we discussed possible serious and likely etiologies, options for evaluation and workup, limitations of telemedicine visit vs in person visit, treatment, treatment risks and precautions. Pt prefers to treat via telemedicine empirically rather then risking or undertaking an in person visit at this moment. She has not been in for labs in a long time and currently has not yet set up PCP as is moving to [employer]. Advise to seek PCP in [place] as soon as she gets there and set up labs, preventive care ect. which she agrees to do. Advised of preventive care due/past due and strongly urged her to get PCP and mammo, colon cancer screening asap. She agrees to do so in [place] as is literally loding up to move as we speek. One refill of BP medication sent at her request so that she does not run out while moving and seeking PCP. Advise healthy diet, regular exercise, weight reduction. Also discussed measures to prevent COVID19, masking, distancing, good hygeine and avoiding gathering with others outside the home, along with vaccination when available.   I discussed the assessment and treatment plan with the patient. The patient was provided an opportunity to ask questions and all were answered. The patient agreed with the plan and demonstrated an understanding of the instructions.   The patient was advised to call back or seek an in-person evaluation if the symptoms worsen or if the condition fails to improve as anticipated.   Lucretia Kern, DO

## 2020-01-28 ENCOUNTER — Other Ambulatory Visit: Payer: Self-pay | Admitting: Family Medicine
